# Patient Record
Sex: Female | Born: 1975 | Race: White | Hispanic: No | Marital: Married | Smoking: Current every day smoker
Health system: Southern US, Community
[De-identification: ages and names within clinical notes are randomized; demographics above are authoritative.]

## PROBLEM LIST (undated history)

## (undated) DIAGNOSIS — I10 Essential (primary) hypertension: Secondary | ICD-10-CM

## (undated) DIAGNOSIS — G43009 Migraine without aura, not intractable, without status migrainosus: Secondary | ICD-10-CM

## (undated) DIAGNOSIS — C801 Malignant (primary) neoplasm, unspecified: Secondary | ICD-10-CM

## (undated) DIAGNOSIS — F419 Anxiety disorder, unspecified: Secondary | ICD-10-CM

## (undated) DIAGNOSIS — Z8489 Family history of other specified conditions: Secondary | ICD-10-CM

## (undated) HISTORY — DX: Essential (primary) hypertension: I10

## (undated) HISTORY — DX: Anxiety disorder, unspecified: F41.9

## (undated) HISTORY — DX: Migraine without aura, not intractable, without status migrainosus: G43.009

## (undated) HISTORY — DX: Malignant (primary) neoplasm, unspecified: C80.1

---

## 1997-09-03 ENCOUNTER — Other Ambulatory Visit: Admission: RE | Admit: 1997-09-03 | Discharge: 1997-09-03 | Payer: Self-pay | Admitting: Obstetrics and Gynecology

## 1998-04-06 ENCOUNTER — Inpatient Hospital Stay (HOSPITAL_COMMUNITY): Admission: AD | Admit: 1998-04-06 | Discharge: 1998-04-08 | Payer: Self-pay | Admitting: Obstetrics and Gynecology

## 1998-05-13 ENCOUNTER — Other Ambulatory Visit: Admission: RE | Admit: 1998-05-13 | Discharge: 1998-05-13 | Payer: Self-pay | Admitting: Obstetrics and Gynecology

## 1999-05-07 ENCOUNTER — Other Ambulatory Visit: Admission: RE | Admit: 1999-05-07 | Discharge: 1999-05-07 | Payer: Self-pay | Admitting: Obstetrics and Gynecology

## 2000-04-25 ENCOUNTER — Other Ambulatory Visit: Admission: RE | Admit: 2000-04-25 | Discharge: 2000-04-25 | Payer: Self-pay | Admitting: Obstetrics and Gynecology

## 2001-05-25 ENCOUNTER — Other Ambulatory Visit: Admission: RE | Admit: 2001-05-25 | Discharge: 2001-05-25 | Payer: Self-pay | Admitting: Obstetrics and Gynecology

## 2002-05-27 ENCOUNTER — Other Ambulatory Visit: Admission: RE | Admit: 2002-05-27 | Discharge: 2002-05-27 | Payer: Self-pay | Admitting: Obstetrics and Gynecology

## 2002-08-01 ENCOUNTER — Ambulatory Visit (HOSPITAL_BASED_OUTPATIENT_CLINIC_OR_DEPARTMENT_OTHER): Admission: RE | Admit: 2002-08-01 | Discharge: 2002-08-01 | Payer: Self-pay | Admitting: Plastic Surgery

## 2002-08-01 ENCOUNTER — Encounter (INDEPENDENT_AMBULATORY_CARE_PROVIDER_SITE_OTHER): Payer: Self-pay | Admitting: Specialist

## 2002-12-31 HISTORY — PX: TONSILLECTOMY AND ADENOIDECTOMY: SHX28

## 2003-05-16 DIAGNOSIS — C801 Malignant (primary) neoplasm, unspecified: Secondary | ICD-10-CM

## 2003-05-16 HISTORY — DX: Malignant (primary) neoplasm, unspecified: C80.1

## 2003-06-20 ENCOUNTER — Other Ambulatory Visit: Admission: RE | Admit: 2003-06-20 | Discharge: 2003-06-20 | Payer: Self-pay | Admitting: Obstetrics and Gynecology

## 2003-08-14 ENCOUNTER — Encounter: Admission: RE | Admit: 2003-08-14 | Discharge: 2003-08-14 | Payer: Self-pay | Admitting: Gastroenterology

## 2004-08-24 ENCOUNTER — Other Ambulatory Visit: Admission: RE | Admit: 2004-08-24 | Discharge: 2004-08-24 | Payer: Self-pay | Admitting: Obstetrics and Gynecology

## 2005-10-18 ENCOUNTER — Other Ambulatory Visit: Admission: RE | Admit: 2005-10-18 | Discharge: 2005-10-18 | Payer: Self-pay | Admitting: Obstetrics and Gynecology

## 2006-11-03 ENCOUNTER — Other Ambulatory Visit: Admission: RE | Admit: 2006-11-03 | Discharge: 2006-11-03 | Payer: Self-pay | Admitting: Obstetrics and Gynecology

## 2007-11-07 ENCOUNTER — Other Ambulatory Visit: Admission: RE | Admit: 2007-11-07 | Discharge: 2007-11-07 | Payer: Self-pay | Admitting: Obstetrics and Gynecology

## 2010-07-02 NOTE — Op Note (Signed)
NAME:  Linda Ford, Linda Ford                         ACCOUNT NO.:  [id number]   MEDICAL RECORD NO.:  [id number]                   PATIENT TYPE:  AMB   LOCATION:  DSC                                  FACILITY:  [facility]   PHYSICIAN:  Brantley Persons, M.D.             DATE OF BIRTH:  08/13/1975   DATE OF PROCEDURE:  08/01/2002  DATE OF DISCHARGE:                                 OPERATIVE REPORT   PREOPERATIVE DIAGNOSES:  1. Basal cell carcinoma, right upper lip.  2. Recurrent infected skin mass, right groin.   POSTOPERATIVE DIAGNOSES:  1. Basal cell carcinoma, right upper lip.  2. Recurrent infected skin mass, right groin.   PROCEDURES:  1. Excision of 1.1 cm basal cell cancer of the right upper lip.  2. Intermediate closure of 1.3 cm right upper lip incision.  3. Excision of 2.0 cm right groin skin mass.  4. Complex closure of 2.5 cm right groin incision.   ATTENDING SURGEON:  Brantley Persons, M.D.   ANESTHESIA:  1% lidocaine with epinephrine.   COMPLICATIONS:  None.   INDICATION FOR PROCEDURE:  The patient is a 35 year old Caucasian female who  was referred by [facility]. Danella Deis, M.D., for excision of a right upper lip  basal cell cancer.  This has been biopsied twice and the pathology indicates  that it might be a basal cell cancer.  She therefore presents to undergo  excision of this skin cancer with an intraoperative frozen section diagnosis  to assess the margins.  Additionally the patient has a soft tissue mass  present in the right groin.  It will sometimes ooze or drain fluid when it  becomes infected.  She has been treated with antibiotics and the  inflammation has resolved; however, the mass is still present, so she would  like to see about having this excised at the same time to avoid recurrent  infections.   PROCEDURE:  The patient was brought to the procedure room and placed on the  table in the supine position.  First the lower half of the face was prepped  with  Betadine and draped in a sterile fashion.  The skin and subcutaneous  tissues in the area of the basal cell cancer were then injected with 1%  lidocaine with epinephrine.  After adequate hemostasis and anesthesia had  taken effect, the procedure was begun.  Using loupe magnification, the basal  cell cancer-yielding biopsy site was identified.  At least 1 cm margins were  then marked circumferentially around the basal cell cancer.  The cancer was  thus excised and marked at the 12 o'clock position with a nylon suture.  The  specimen was then sent to the pathologist for an intraoperative frozen  section diagnosis.  I was on standby while the pathologist, Molly Maduro T.  Jennye Moccasin, M.D., read the frozen section.  He felt that the basal cell cancer  had been removed entirely with clear  surgical margins.  We therefore  proceeded with closure of this wound.  The skin edges were then undermined  for easier closure.  The wound was closed in intermediate fashion.  The  deeper sutures and dermal layer were both closed with 3-0 Monocryl suture.  The skin was then closed with a 6-0 Prolene running baseball-type stitch.  Attention was then turned to the right groin.  The groin was prepped with  Betadine and draped in a sterile fashion.  The skin and subcutaneous tissues  in the area of the mass were then injected with 1% lidocaine with  epinephrine.  After adequate hemostasis and anesthesia had taken effect, the  procedure was begun.  Using loupe magnification, all of the skin changes  that were present were identified.  The mass was also palpated.  The lines  of skin excision were then marked around this area.  There was at least a 1  mm margin present around the skin changes.  The mass was thus excised full-  thickness through the subcutaneous tissues down to the superficial fascial  layer.  The wound was then closed in complex fashion.  The fascia was closed  using 3-0 Monocryl suture.  The dermis was then  closed also with 3-0  Monocryl suture.  The skin also was then closed with a 3-0 Monocryl suture.  The right groin incision was dressed with Benzoin and Steri-Strips and the  right upper lip incision with bacitracin ointment.  There were no  complications.  The patient tolerated the procedure well.  She was then  given proper postoperative wound care instructions and discharged home in  stable condition.  Follow-up appointment will be tomorrow in the office.  Total length of basal cell cancer removed was 1.1 cm with a 1.3 cm  intermediate closure of the incision.  Total length of groin mass removed  was 2.0 cm, as this was necessary to get entirely around the lesion and  granulation tissue, along with a 2.5 cm complex closure of the incision.                                               Brantley Persons, M.D.    MC/MEDQ  D:  08/01/2002  T:  08/05/2002  Job:  [id number]

## 2011-02-15 HISTORY — PX: REFRACTIVE SURGERY: SHX103

## 2012-04-18 HISTORY — PX: NOVASURE ABLATION: SHX5394

## 2012-05-18 ENCOUNTER — Other Ambulatory Visit: Payer: Self-pay | Admitting: *Deleted

## 2012-05-18 MED ORDER — NADOLOL 40 MG PO TABS: 40.0000 mg | ORAL_TABLET | Freq: Every day | ORAL | Status: DC

## 2012-05-18 NOTE — Telephone Encounter (Signed)
Last seen 12/13 

## 2012-06-08 ENCOUNTER — Encounter: Payer: Self-pay | Admitting: Gynecology

## 2012-06-13 ENCOUNTER — Ambulatory Visit: Payer: Self-pay | Admitting: Gynecology

## 2012-06-22 ENCOUNTER — Ambulatory Visit (INDEPENDENT_AMBULATORY_CARE_PROVIDER_SITE_OTHER): Payer: BC Managed Care – PPO | Admitting: Gynecology

## 2012-06-22 VITALS — BP 126/68

## 2012-06-22 DIAGNOSIS — N898 Other specified noninflammatory disorders of vagina: Secondary | ICD-10-CM

## 2012-06-22 DIAGNOSIS — N920 Excessive and frequent menstruation with regular cycle: Secondary | ICD-10-CM | POA: Insufficient documentation

## 2012-06-22 LAB — POCT WET PREP (WET MOUNT): Clue Cells Wet Prep Whiff POC: NEGATIVE

## 2012-06-22 NOTE — Progress Notes (Signed)
Subjective:     Patient ID: Larene Beach, female   DOB: 12/29/1975, 37 y.o.   MRN: [id number]  HPI Comments: Pt presents 6w s/p novasure ablation done in the office, pt reports no menses, cramped and spotted for 2d.  Pt reports brownish sludge d/c-pantiliner changed 2-3x/d Denies fever, chills,.  No dyspareunia.  No change in d/c after sex.     Review of Systems Per HPI    Objective:   Physical Exam BP 126/68  LMP 05/15/2012 General appearance: alert, cooperative and appears stated age Abdomen: soft, non-tender; bowel sounds normal; no masses,  no organomegaly Pelvic: cervix normal in appearance, external genitalia normal, no adnexal masses or tenderness, no cervical motion tenderness and uterus normal size, shape, and consistency No uterine tenderness, thin brown d/c in vault    Assessment:     post-op novasure- overall doing well Vaginal discharge    Plan:     Pt to watch menses, anticipate they will be light or none Discharge normal s/p ablation-pt assured F/u prn

## 2012-07-26 ENCOUNTER — Other Ambulatory Visit: Payer: Self-pay | Admitting: Nurse Practitioner

## 2012-07-30 ENCOUNTER — Other Ambulatory Visit: Payer: Self-pay | Admitting: Nurse Practitioner

## 2012-07-30 NOTE — Telephone Encounter (Signed)
Last seen 12/13 

## 2012-09-10 ENCOUNTER — Other Ambulatory Visit: Payer: Self-pay | Admitting: Nurse Practitioner

## 2012-10-16 ENCOUNTER — Ambulatory Visit: Payer: BC Managed Care – PPO | Admitting: Nurse Practitioner

## 2012-11-21 ENCOUNTER — Encounter: Payer: Self-pay | Admitting: Gynecology

## 2012-12-04 ENCOUNTER — Ambulatory Visit (INDEPENDENT_AMBULATORY_CARE_PROVIDER_SITE_OTHER): Payer: BC Managed Care – PPO

## 2012-12-04 ENCOUNTER — Encounter: Payer: Self-pay | Admitting: Family Medicine

## 2012-12-04 ENCOUNTER — Encounter (INDEPENDENT_AMBULATORY_CARE_PROVIDER_SITE_OTHER): Payer: Self-pay

## 2012-12-04 ENCOUNTER — Ambulatory Visit (INDEPENDENT_AMBULATORY_CARE_PROVIDER_SITE_OTHER): Payer: BC Managed Care – PPO | Admitting: Family Medicine

## 2012-12-04 VITALS — BP 102/69 | HR 66 | Temp 98.3°F | Ht 63.5 in | Wt 134.0 lb

## 2012-12-04 DIAGNOSIS — M24139 Other articular cartilage disorders, unspecified wrist: Secondary | ICD-10-CM

## 2012-12-04 DIAGNOSIS — M25531 Pain in right wrist: Secondary | ICD-10-CM

## 2012-12-04 DIAGNOSIS — M25539 Pain in unspecified wrist: Secondary | ICD-10-CM

## 2012-12-04 DIAGNOSIS — M25331 Other instability, right wrist: Secondary | ICD-10-CM

## 2012-12-04 MED ORDER — MELOXICAM 15 MG PO TABS: 15.0000 mg | ORAL_TABLET | Freq: Every day | ORAL | Status: DC

## 2012-12-04 NOTE — Progress Notes (Signed)
New Patient History and Physical  Patient name: Linda Ford Medical record number: [id number] Date of birth: 1976/01/09 Age: 37 y.o. Gender: female  Primary Care Provider: Bennie Pierini, FNP  Chief Complaint: wrist pain  History of Present Illness: The patient reports ulnar sided wrist pain over the past 3-4 days. Predominantly over the ulnar styloid. Pain is not constant but does seem to be exacerbated by certain movements like respiration supination. Minimal numbness and tingling. No swelling. No known injury, though patient will she help the most boxes late last week. Denies falling or direct trauma.   Past Medical History: Patient Active Problem List   Diagnosis Date Noted  . Menorrhagia 06/22/2012   Past Medical History  Diagnosis Date  . Hypertension   . Anxiety   . Cancer 04/05    basal cell, face and legs  . Migraine     Past Surgical History: Past Surgical History  Procedure Laterality Date  . Tonsillectomy and adenoidectomy  12/31/02    Social History: History   Social History  . Marital Status: Married    Spouse Name: N/A    Number of Children: N/A  . Years of Education: N/A   Social History Main Topics  . Smoking status: Former Smoker    Types: Cigarettes    Quit date: 12/01/2012  . Smokeless tobacco: Never Used  . Alcohol Use: Yes     Comment: occ wine  . Drug Use: No  . Sexual Activity: Yes    Birth Control/ Protection: Surgical     Comment: vasectomy   Other Topics Concern  . None   Social History Narrative  . None    Family History: Family History  Problem Relation Age of Onset  . Heart disease Father     Allergies: Allergies  Allergen Reactions  . Macrobid [Nitrofurantoin] Hives    Current Outpatient Prescriptions  Medication Sig Dispense Refill  . sertraline (ZOLOFT) 100 MG tablet Take 100 mg by mouth daily.      . valACYclovir (VALTREX) 1000 MG tablet TAKE 2 TABLETS BY MOUTH TWICE A DAY AT FEVER BLISTER ONSET  12  tablet  1  . meloxicam (MOBIC) 15 MG tablet Take 1 tablet (15 mg total) by mouth daily.  30 tablet  1   No current facility-administered medications for this visit.   Review Of Systems: 12 point ROS negative except as noted above in HPI.  Physical Exam: Filed Vitals:   12/04/12 1500  BP: 102/69  Pulse: 66  Temp: 98.3 F (36.8 C)    General: alert and cooperative HEENT: PERRLA and extra ocular movement intact Heart: S1, S2 normal, no murmur, rub or gallop, regular rate and rhythm Lungs: clear to auscultation Abdomen: abdomen is soft without significant tenderness, masses, organomegaly or guarding Extremities: extremities normal, atraumatic, no cyanosis or edema and Positive ulnar side pain of the right wrist. Positive pain with ulnar deviation. Positive pain over same distribution with resisted supination and pronation. Grip strength contact. Neurovascularly intact. Skin:no rashes Neurology: normal without focal findings  Labs and Imaging: [facility] reading (PRIMARY) by  Dr. Alvester Morin Right wrist x-ray preliminarily negative for acute fracture or dislocation.                                    Assessment and Plan: Wrist pain, acute, right - Plan: DG Wrist Complete Right, meloxicam (MOBIC) 15 MG tablet  TFCC (triangular fibrocartilage complex) injury,  right - Plan: meloxicam (MOBIC) 15 MG tablet  Suspect TFCC injury. We'll place her respirations. NSAIDs. Rice. Discussed general musculoskeletal route 5. Follow up as needed.       Doree Albee MD

## 2012-12-04 NOTE — Patient Instructions (Signed)
Wrist Pain  Wrist injuries are frequent in adults and children. A sprain is an injury to the ligaments that hold your bones together. A strain is an injury to muscle or muscle cord-like structures (tendons) from stretching or pulling. Generally, when wrists are moderately tender to touch following a fall or injury, a break in the bone (fracture) may be present. Most wrist sprains or strains are better in 3 to 5 days, but complete healing may take several weeks.  HOME CARE INSTRUCTIONS    Put ice on the injured area.   Put ice in a plastic bag.   Place a towel between your skin and the bag.   Leave the ice on for 15-20 minutes, 3-4 times a day, for the first 2 days.   Keep your arm raised above the level of your heart whenever possible to reduce swelling and pain.   Rest the injured area for at least 48 hours or as directed by your caregiver.   If a splint or elastic bandage has been applied, use it for as long as directed by your caregiver or until seen by a caregiver for a follow-up exam.   Only take over-the-counter or prescription medicines for pain, discomfort, or fever as directed by your caregiver.   Keep all follow-up appointments. You may need to follow up with a specialist or have follow-up X-rays. Improvement in pain level is not a guarantee that you did not fracture a bone in your wrist. The only way to determine whether or not you have a broken bone is by X-ray.  SEEK IMMEDIATE MEDICAL CARE IF:    Your fingers are swollen, very red, white, or cold and blue.   Your fingers are numb or tingling.   You have increasing pain.   You have difficulty moving your fingers.  MAKE SURE YOU:    Understand these instructions.   Will watch your condition.   Will get help right away if you are not doing well or get worse.  Document Released: 11/10/2004 Document Revised: 04/25/2011 Document Reviewed: 03/24/2010  Exit[employer] Patient Information 2014 [employer].

## 2013-03-14 ENCOUNTER — Ambulatory Visit: Payer: Self-pay | Admitting: Gynecology

## 2013-03-15 ENCOUNTER — Ambulatory Visit: Payer: Self-pay | Admitting: Gynecology

## 2013-03-27 ENCOUNTER — Ambulatory Visit: Payer: Self-pay | Admitting: Gynecology

## 2013-04-05 ENCOUNTER — Ambulatory Visit: Payer: Self-pay | Admitting: Gynecology

## 2013-04-12 ENCOUNTER — Telehealth: Payer: Self-pay | Admitting: *Deleted

## 2013-04-12 ENCOUNTER — Other Ambulatory Visit: Payer: Self-pay | Admitting: *Deleted

## 2013-04-12 MED ORDER — SERTRALINE HCL 100 MG PO TABS: 100.0000 mg | ORAL_TABLET | Freq: Every day | ORAL | Status: DC

## 2013-04-12 NOTE — Telephone Encounter (Signed)
Encounter opened in error.  See previous note for refill request. Closing encounter.

## 2013-04-12 NOTE — Telephone Encounter (Signed)
Patient calling to reschedule annual that is scheduled for 04-15-13.  Due to inclement weather, meetings have been rescheduled to appt time.   Appt rescheduled to 05-01-13 and requests one month refill Zoloft to get to appointment sinc eshe is completely out.  [facility]. Advised we will call her back if any problem with refill.    Last AEX 03-08-12 with Dr Charlies Constable, see preEPIC chart. Last OV 06-2012.  Zoloft is in computer but I don't see here we have prescribed this for her.  Ok to refill?  Paper chart to your office.

## 2013-04-12 NOTE — Telephone Encounter (Deleted)
eScribe request from [facility] for refill on SERTRALINE Last filled - *** Last AEX - *** Next AEX - ***

## 2013-04-15 ENCOUNTER — Ambulatory Visit: Payer: Self-pay | Admitting: Gynecology

## 2013-05-01 ENCOUNTER — Ambulatory Visit (INDEPENDENT_AMBULATORY_CARE_PROVIDER_SITE_OTHER): Payer: BC Managed Care – PPO | Admitting: Gynecology

## 2013-05-01 ENCOUNTER — Encounter: Payer: Self-pay | Admitting: Gynecology

## 2013-05-01 VITALS — BP 114/60 | HR 70 | Resp 18 | Ht 63.0 in | Wt 134.0 lb

## 2013-05-01 DIAGNOSIS — Z0000 Encounter for general adult medical examination without abnormal findings: Secondary | ICD-10-CM

## 2013-05-01 DIAGNOSIS — I10 Essential (primary) hypertension: Secondary | ICD-10-CM | POA: Insufficient documentation

## 2013-05-01 DIAGNOSIS — C4491 Basal cell carcinoma of skin, unspecified: Secondary | ICD-10-CM | POA: Insufficient documentation

## 2013-05-01 DIAGNOSIS — Z01419 Encounter for gynecological examination (general) (routine) without abnormal findings: Secondary | ICD-10-CM

## 2013-05-01 DIAGNOSIS — G43009 Migraine without aura, not intractable, without status migrainosus: Secondary | ICD-10-CM | POA: Insufficient documentation

## 2013-05-01 LAB — POCT URINALYSIS DIPSTICK
LEUKOCYTES UA: NEGATIVE
Urobilinogen, UA: NEGATIVE
pH, UA: 5

## 2013-05-01 NOTE — Progress Notes (Signed)
38 y.o. Married Caucasian female   G2P0 here for annual exam. Pt is currently sexually active.  Pt is 1y s/p ablation, overall cycles are very light and regular and she is very pleased.  Pt reports constant sinus drainage, no change with quitting smoking 80m ago.  Has not seen ENT.  No LMP recorded. Patient has had an ablation.          Sexually active: yes  The current method of family planning is Ablation and Husband has Vasectomy. .    Exercising: no  The patient does not participate in regular exercise at present. Last pap: 03/08/12 NEG HR HPV Alcohol:  Yes 1-2 drinks/wk glass of wine Tobacco: no BSE: yes  Mammogram: never  Hgb: 12.7 ; Urine: Negative    Health Maintenance  Topic Date Due  . Tetanus/tdap  05/31/1992  . Influenza Vaccine  09/14/2012  . Pap Smear  03/09/2015    Family History  Problem Relation Age of Onset  . Heart disease Father     Patient Active Problem List   Diagnosis Date Noted  . Menorrhagia 06/22/2012    Past Medical History  Diagnosis Date  . Hypertension   . Anxiety   . Cancer 04/05    basal cell, face and legs  . Migraine     Past Surgical History  Procedure Laterality Date  . Tonsillectomy and adenoidectomy  12/31/02    Allergies: Macrobid  Current Outpatient Prescriptions  Medication Sig Dispense Refill  . meloxicam (MOBIC) 15 MG tablet Take 1 tablet (15 mg total) by mouth daily.  30 tablet  1  . sertraline (ZOLOFT) 100 MG tablet Take 1 tablet (100 mg total) by mouth daily.  30 tablet  1  . valACYclovir (VALTREX) 1000 MG tablet TAKE 2 TABLETS BY MOUTH TWICE A DAY AT FEVER BLISTER ONSET  12 tablet  1   No current facility-administered medications for this visit.    ROS: Pertinent items are noted in HPI.  Exam:    Ht 5\' 3"  (1.6 m)  Wt 134 lb (60.782 kg)  BMI 23.74 kg/m2 Weight change: @WEIGHTCHANGE @ Last 3 height recordings:  Ht Readings from Last 3 Encounters:  05/01/13 5\' 3"  (1.6 m)  12/04/12 5' 3.5" (1.613 m)    General appearance: alert, cooperative and appears stated age Head: Normocephalic, without obvious abnormality, atraumatic Neck: no adenopathy, no carotid bruit, no JVD, supple, symmetrical, trachea midline and thyroid not enlarged, symmetric, no tenderness/mass/nodules Lungs: clear to auscultation bilaterally Breasts: normal appearance, no masses or tenderness Heart: regular rate and rhythm, S1, S2 normal, no murmur, click, rub or gallop Abdomen: soft, non-tender; bowel sounds normal; no masses,  no organomegaly Extremities: extremities normal, atraumatic, no cyanosis or edema Skin: Skin color, texture, turgor normal. No rashes or lesions Lymph nodes: Cervical, supraclavicular, and axillary nodes normal. no inguinal nodes palpated Neurologic: Grossly normal   Pelvic: External genitalia:  well estrogenized              Urethra: normal appearing urethra with no masses, tenderness or lesions              Bartholins and Skenes: Bartholin's, Urethra, Skene's normal                 Vagina: normal appearing vagina with normal color and discharge, no lesions              Cervix: normal appearance              Pap taken:  no        Bimanual Exam:  Uterus:  uterus is normal size, shape, consistency and nontender                                      Adnexa:    normal adnexa in size, nontender and no masses                                      Rectovaginal: Confirms                                      Anus:  normal sphincter tone, no lesions  A: well woman     P: mammogram Cycles controled after novasure counseled on breast self exam, mammography screening, adequate intake of calcium and vitamin D, diet and exercise return annually or prn   An After Visit Summary was printed and given to the patient.

## 2013-05-01 NOTE — Patient Instructions (Signed)

## 2013-05-02 LAB — HEMOGLOBIN, FINGERSTICK: Hemoglobin, fingerstick: 12.7 g/dL (ref 12.0–16.0)

## 2013-05-22 ENCOUNTER — Other Ambulatory Visit: Payer: Self-pay | Admitting: *Deleted

## 2013-05-22 ENCOUNTER — Telehealth: Payer: Self-pay | Admitting: Gynecology

## 2013-05-22 NOTE — Telephone Encounter (Signed)
Last refilled: 04/12/13 #30/1 refill Last AEX: 05/01/13  AEX Scheduled: 05/06/14  Okay to refill?  Please Advise.

## 2013-05-22 NOTE — Telephone Encounter (Signed)
Patient is asking for a refill for sertraline (ZOLOFT) 100 MG tablet  Take 1 tablet (100 mg total) by mouth daily., Starting 04/12/2013, Until Discontinued, Normal, Last Dose: Not Recorded  Refills: 1 ordered [facility]acy: [id number] [place], [place]

## 2013-05-24 MED ORDER — SERTRALINE HCL 100 MG PO TABS: 100.0000 mg | ORAL_TABLET | Freq: Every day | ORAL | Status: AC

## 2013-05-24 NOTE — Telephone Encounter (Signed)
Approved      Disp Refills Start End    sertraline (ZOLOFT) 100 MG tablet 90 tablet 3      Sig - Route: Take 1 tablet (100 mg total) by mouth daily. - Oral    Class: Normal    Authorizing Provider: Azalia Bilis, MD     Patient notified that rx has been approved and sent by Dr. Charlies Constable

## 2013-05-24 NOTE — Telephone Encounter (Signed)
4/8 LMTCB

## 2013-12-16 ENCOUNTER — Encounter: Payer: Self-pay | Admitting: Gynecology

## 2013-12-19 ENCOUNTER — Telehealth: Payer: Self-pay | Admitting: Obstetrics & Gynecology

## 2013-12-19 NOTE — Telephone Encounter (Signed)
Patient calling requesting an RX to "treast a yeast infection." She declined an appointment stating, "There is no way I can come in." Please advise?

## 2013-12-19 NOTE — Telephone Encounter (Signed)
Left message to call Kaitlyn at [phone]. 

## 2013-12-24 NOTE — Telephone Encounter (Signed)
Spoke with patient. Patient states "I am better so I am just not going to worry about it." Advised if she changes her mind or needs anything to give [place] a call. Patient is agreeable.  Routing to provider for final review. Patient agreeable to disposition. Will close encounter

## 2014-05-06 ENCOUNTER — Ambulatory Visit: Payer: BC Managed Care – PPO | Admitting: Gynecology

## 2014-05-13 ENCOUNTER — Ambulatory Visit: Payer: BC Managed Care – PPO | Admitting: Obstetrics & Gynecology

## 2014-06-20 ENCOUNTER — Other Ambulatory Visit: Payer: Self-pay | Admitting: *Deleted

## 2014-06-20 NOTE — Telephone Encounter (Signed)
Incoming fax from [place]   Medication refill request: Sertraline  Last AEX:  05/01/13 TL Next AEX: not schedule Last MMG (if hormonal medication request): none Refill authorized: 05/24/13 #90tabs/ 3 R.  Called patient. She states she has tranfer her care some where else and to disregard request.

## 2018-11-23 ENCOUNTER — Other Ambulatory Visit: Payer: Self-pay | Admitting: *Deleted

## 2018-11-23 DIAGNOSIS — Z20822 Contact with and (suspected) exposure to covid-19: Secondary | ICD-10-CM

## 2018-11-25 LAB — NOVEL CORONAVIRUS, NAA: SARS-CoV-2, NAA: NOT DETECTED

## 2019-04-21 ENCOUNTER — Ambulatory Visit: Payer: Self-pay | Attending: Internal Medicine

## 2019-04-21 DIAGNOSIS — Z23 Encounter for immunization: Secondary | ICD-10-CM | POA: Insufficient documentation

## 2019-04-21 NOTE — Progress Notes (Signed)
   Covid-19 Vaccination Clinic  Name:  Linda Ford    MRN: [id number] DOB: 04-15-1975  04/21/2019  Linda Ford was observed post Covid-19 immunization for 15 minutes without incident. She was provided with Vaccine Information Sheet and instruction to access the V-Safe system.   Linda Ford was instructed to call 911 with any severe reactions post vaccine: Marland Kitchen Difficulty breathing  . Swelling of face and throat  . A fast heartbeat  . A bad rash all over body  . Dizziness and weakness   Immunizations Administered    Name Date Dose VIS Date Route   Pfizer COVID-19 Vaccine 04/21/2019  5:36 PM 0.3 mL 01/25/2019 Intramuscular   Manufacturer: [place]   Lot: [id number]   [place]: [id number]

## 2019-05-12 ENCOUNTER — Ambulatory Visit: Payer: Self-pay | Attending: Internal Medicine

## 2019-05-12 DIAGNOSIS — Z23 Encounter for immunization: Secondary | ICD-10-CM

## 2019-05-12 NOTE — Progress Notes (Signed)
   Covid-19 Vaccination Clinic  Name:  PAW CARBINE    MRN: [id number] DOB: 10/20/1975  05/12/2019  Ms. Schwimmer was observed post Covid-19 immunization for 15 minutes without incident. She was provided with Vaccine Information Sheet and instruction to access the V-Safe system.   Ms. Saldate was instructed to call 911 with any severe reactions post vaccine: Marland Kitchen Difficulty breathing  . Swelling of face and throat  . A fast heartbeat  . A bad rash all over body  . Dizziness and weakness   Immunizations Administered    Name Date Dose VIS Date Route   Pfizer COVID-19 Vaccine 05/12/2019  3:52 PM 0.3 mL 01/25/2019 Intramuscular   Manufacturer: [employer]   Lot: [id number]   [place]: [id number]

## 2019-05-29 ENCOUNTER — Ambulatory Visit: Payer: Self-pay | Attending: Internal Medicine

## 2019-05-29 DIAGNOSIS — Z20822 Contact with and (suspected) exposure to covid-19: Secondary | ICD-10-CM

## 2019-05-30 LAB — NOVEL CORONAVIRUS, NAA: SARS-CoV-2, NAA: NOT DETECTED

## 2019-05-30 LAB — SARS-COV-2, NAA 2 DAY TAT

## 2019-07-30 ENCOUNTER — Other Ambulatory Visit: Payer: Self-pay

## 2019-07-30 ENCOUNTER — Encounter: Payer: Self-pay | Admitting: Physician Assistant

## 2019-07-30 ENCOUNTER — Ambulatory Visit: Payer: BC Managed Care – PPO | Admitting: Physician Assistant

## 2019-07-30 DIAGNOSIS — Z1283 Encounter for screening for malignant neoplasm of skin: Secondary | ICD-10-CM | POA: Diagnosis not present

## 2019-07-30 DIAGNOSIS — L700 Acne vulgaris: Secondary | ICD-10-CM

## 2019-07-30 DIAGNOSIS — K130 Diseases of lips: Secondary | ICD-10-CM | POA: Diagnosis not present

## 2019-07-30 DIAGNOSIS — D485 Neoplasm of uncertain behavior of skin: Secondary | ICD-10-CM

## 2019-07-30 MED ORDER — ALCLOMETASONE DIPROPIONATE 0.05 % EX CREA: TOPICAL_CREAM | Freq: Two times a day (BID) | CUTANEOUS | 1 refills | Status: AC | PRN

## 2019-07-30 MED ORDER — AMZEEQ 4 % EX FOAM
1.0000 | Freq: Every day | CUTANEOUS | 0 refills | Status: DC
Start: 2019-07-30 — End: 2020-01-21

## 2019-07-30 MED ORDER — KETOCONAZOLE 2 % EX CREA: 1.0000 "application " | TOPICAL_CREAM | Freq: Two times a day (BID) | CUTANEOUS | 0 refills | Status: AC

## 2019-07-30 NOTE — Progress Notes (Signed)
   Follow-Up Visit   Subjective  Linda Ford is a 44 y.o. female who presents for the following: Skin Problem (corners of mouth cracking open x 1 year- tx- clotrimazole & hydro cream - helps some , right cheek- puffy spot x 6-8 months).   The following portions of the chart were reviewed this encounter and updated as appropriate: Tobacco  Allergies  Meds  Problems  Med Hx  Surg Hx  Fam Hx      Objective  Well appearing patient in no apparent distress; mood and affect are within normal limits.  All skin waist up examined.  Objective  Waist Up: No Dn or signs of NMSC  Objective  Right Malar Cheek: Pink plaque.     Objective  Mid Lower Cutaneous Lip: cysts  Objective  Left Oral Commissure, Right Oral Commissure: Redness with slight scale  Assessment & Plan  Screening exam for skin cancer Waist Up  Yearly skin exams  Neoplasm of uncertain behavior of skin Right Malar Cheek  Epidermal / dermal shaving  Lesion diameter (cm):  1.1 Informed consent: discussed and consent obtained   Timeout: patient name, date of birth, surgical site, and procedure verified   Procedure prep:  Patient was prepped and draped in usual sterile fashion Prep type:  Chlorhexidine Anesthesia: the lesion was anesthetized in a standard fashion   Anesthetic:  1% lidocaine w/ epinephrine 1-100,000 local infiltration Instrument used: DermaBlade   Hemostasis achieved with: aluminum chloride   Outcome: patient tolerated procedure well   Post-procedure details: sterile dressing applied and wound care instructions given   Dressing type: petrolatum gauze, petrolatum and bandage    Specimen 1 - Surgical pathology Differential Diagnosis: epidermal hyperplasia, bcc scc Check Margins: No  Acne vulgaris Mid Lower Cutaneous Lip  Ordered Medications: Minocycline HCl Micronized (AMZEEQ) 4 % FOAM  Angular cheilitis (2) Left Oral Commissure; Right Oral Commissure  alclomethasone  (ACLOVATE) 0.05 % cream - Left Oral Commissure, Right Oral Commissure  Anaerobic and Aerobic Culture - Left Oral Commissure, Right Oral Commissure  ketoconazole (NIZORAL) 2 % cream - Left Oral Commissure, Right Oral Commissure

## 2019-07-30 NOTE — Patient Instructions (Signed)

## 2019-08-01 ENCOUNTER — Telehealth: Payer: Self-pay | Admitting: Physician Assistant

## 2019-08-01 NOTE — Telephone Encounter (Signed)
Was given 2 Rx's; not sure which one is for what. Alclometasone (for corners of mouth?) and ketoconazole (for where place was taken off face?)

## 2019-08-01 NOTE — Telephone Encounter (Signed)
Phone call to patient regarding question on where to use the two prescriptions she was given by Azalee Course.  Voicemail left for patient to give the office a call back.

## 2019-08-01 NOTE — Telephone Encounter (Signed)
Left message on patient voicemail reminding her that she is suppose to mix the two prescriptions together and put on corners of mouth.  Told patient to call us if needed.

## 2019-08-05 ENCOUNTER — Telehealth: Payer: Self-pay

## 2019-08-05 LAB — ANAEROBIC AND AEROBIC CULTURE
MICRO NUMBER:: 10592917
MICRO NUMBER:: 10592918
SPECIMEN QUALITY:: ADEQUATE
SPECIMEN QUALITY:: ADEQUATE

## 2019-08-05 NOTE — Telephone Encounter (Signed)
Phone call to patient with her results.  Patient aware of results.

## 2019-08-05 NOTE — Telephone Encounter (Signed)
-----   Message from Warren Danes, [place] sent at 08/02/2019  5:10 PM EDT ----- No infection continue topical treatment

## 2019-09-11 ENCOUNTER — Other Ambulatory Visit (HOSPITAL_COMMUNITY): Payer: Self-pay | Admitting: Obstetrics & Gynecology

## 2019-09-11 DIAGNOSIS — R062 Wheezing: Secondary | ICD-10-CM

## 2019-09-12 ENCOUNTER — Other Ambulatory Visit: Payer: Self-pay

## 2019-09-12 ENCOUNTER — Ambulatory Visit (HOSPITAL_COMMUNITY)
Admission: RE | Admit: 2019-09-12 | Discharge: 2019-09-12 | Disposition: A | Discharge status: Home / Self Care | Payer: BC Managed Care – PPO | Source: Ambulatory Visit | Attending: Obstetrics & Gynecology | Admitting: Obstetrics & Gynecology

## 2019-09-12 DIAGNOSIS — R062 Wheezing: Secondary | ICD-10-CM | POA: Insufficient documentation

## 2020-01-28 NOTE — Progress Notes (Signed)
Linda Ford  01/28/2020      Your procedure is scheduled on12/28/2021   Report to [place]  at   [place].  Call this number if you have problems the morning of surgery:[phone]  OUR ADDRESS IS [place], WE ARE LOCATED IN THE [facility] WITH [facility].   Remember:  Do not eat food after midnight. May have clear liquids from 12 midnite until 0430am morning of surgery thyen nothing by mouth.  Drink ensure preop drink by 0430am  Morning of surgery.       CLEAR LIQUID DIET   Foods Allowed                                                                 Coffee and tea, regular and decaf                             l  Plain Jell-O any favor except red or purple                                         Fruit ices (not with fruit pulp)                                     Iced Popsicles                                  Carbonated beverages, regular and diet                                    Cranberry, grape and apple juices Sports drinks like Gatorade Lightly seasoned clear broth or consume(fat free) Sugar, honey syrup        _____________________________________________________________________    Take these medicines the morning of surgery with A SIP OF WATER  Zyrtec, zoloft    Do not wear jewelry, make-up or nail polish.  Do not wear lotions, powders, or perfumes, or deoderant.  Do not shave 48 hours prior to surgery.  Men may shave face and neck.  Do not bring valuables to the hospital.  [facility] is not responsible for any belongings or valuables.  Contacts, dentures or bridgework may not be worn into surgery.  Leave your suitcase in the car.  After surgery it may be brought to your room.  For patients admitted to the hospital, discharge time will be determined by your treatment team.  Patients discharged the day of surgery will not be allowed to drive home.     Please read over the following fact sheets that you  were given:   [facility] - Preparing for Surgery Before surgery, you can play an important role.  Because skin is not sterile, your skin needs to be as free of germs as possible.  You can reduce the number of germs on your skin by washing with CHG (chlorahexidine  gluconate) soap before surgery.  CHG is an antiseptic cleaner which kills germs and bonds with the skin to continue killing germs even after washing. Please DO NOT use if you have an allergy to CHG or antibacterial soaps.  If your skin becomes reddened/irritated stop using the CHG and inform your nurse when you arrive at Short Stay. Do not shave (including legs and underarms) for at least 48 hours prior to the first CHG shower.  You may shave your face/neck. Please follow these instructions carefully:  1.  Shower with CHG Soap the night before surgery and the  morning of Surgery.  2.  If you choose to wash your hair, wash your hair first as usual with your  normal  shampoo.  3.  After you shampoo, rinse your hair and body thoroughly to remove the  shampoo.                           4.  Use CHG as you would any other liquid soap.  You can apply chg directly  to the skin and wash                       Gently with a scrungie or clean washcloth.  5.  Apply the CHG Soap to your body ONLY FROM THE NECK DOWN.   Do not use on face/ open                           Wound or open sores. Avoid contact with eyes, ears mouth and genitals (private parts).                       Wash face,  Genitals (private parts) with your normal soap.             6.  Wash thoroughly, paying special attention to the area where your surgery  will be performed.  7.  Thoroughly rinse your body with warm water from the neck down.  8.  DO NOT shower/wash with your normal soap after using and rinsing off  the CHG Soap.                9.  Pat yourself dry with a clean towel.            10.  Wear clean pajamas.            11.  Place clean sheets on your bed the night of your  first shower and do not  sleep with pets. Day of Surgery : Do not apply any lotions/deodorants the morning of surgery.  Please wear clean clothes to the hospital/surgery center.  FAILURE TO FOLLOW THESE INSTRUCTIONS MAY RESULT IN THE CANCELLATION OF YOUR SURGERY PATIENT SIGNATURE_________________________________  NURSE SIGNATURE__________________________________  ________________________________________________________________________

## 2020-01-30 ENCOUNTER — Encounter (HOSPITAL_COMMUNITY): Payer: Self-pay

## 2020-01-30 ENCOUNTER — Encounter (HOSPITAL_COMMUNITY)
Admission: RE | Admit: 2020-01-30 | Discharge: 2020-01-30 | Disposition: A | Discharge status: Home / Self Care | Payer: BC Managed Care – PPO | Source: Ambulatory Visit | Attending: Obstetrics & Gynecology | Admitting: Obstetrics & Gynecology

## 2020-01-30 ENCOUNTER — Other Ambulatory Visit: Payer: Self-pay

## 2020-01-30 DIAGNOSIS — Z01818 Encounter for other preprocedural examination: Secondary | ICD-10-CM | POA: Diagnosis not present

## 2020-01-30 HISTORY — DX: Family history of other specified conditions: Z84.89

## 2020-01-30 LAB — CBC
HCT: 41 % (ref 36.0–46.0)
Hemoglobin: 13.7 g/dL (ref 12.0–15.0)
MCH: 31.5 pg (ref 26.0–34.0)
MCHC: 33.4 g/dL (ref 30.0–36.0)
MCV: 94.3 fL (ref 80.0–100.0)
Platelets: 202 10*3/uL (ref 150–400)
RBC: 4.35 MIL/uL (ref 3.87–5.11)
RDW: 12 % (ref 11.5–15.5)
WBC: 7.5 10*3/uL (ref 4.0–10.5)
nRBC: 0 % (ref 0.0–0.2)

## 2020-01-30 LAB — COMPREHENSIVE METABOLIC PANEL
ALT: 14 U/L (ref 0–44)
AST: 32 U/L (ref 15–41)
Albumin: 4.7 g/dL (ref 3.5–5.0)
Alkaline Phosphatase: 33 U/L — ABNORMAL LOW (ref 38–126)
Anion gap: 9 (ref 5–15)
BUN: 15 mg/dL (ref 6–20)
CO2: 27 mmol/L (ref 22–32)
Calcium: 9.4 mg/dL (ref 8.9–10.3)
Chloride: 103 mmol/L (ref 98–111)
Creatinine, Ser: 0.84 mg/dL (ref 0.44–1.00)
GFR, Estimated: >60 mL/min (ref >60)
Glucose, Bld: 93 mg/dL (ref 70–99)
Potassium: 3.4 mmol/L — ABNORMAL LOW (ref 3.5–5.1)
Sodium: 139 mmol/L (ref 135–145)
Total Bilirubin: 0.6 mg/dL (ref 0.3–1.2)
Total Protein: 7.3 g/dL (ref 6.5–8.1)

## 2020-01-30 LAB — TYPE AND SCREEN
ABO/RH(D): A POS
Antibody Screen: NEGATIVE

## 2020-01-30 NOTE — Progress Notes (Signed)
CMP done 01/30/2020 routed to Dr Linda Hedges.

## 2020-02-05 NOTE — H&P (Signed)
Linda Ford is an 44 y.o. female G2P2 with heavy, painful menses s/p endometrial ablation in 2014.  Patient has HTN and is a smoker and has tried POP with improvement in menstrual symptoms but with intolerable side effects (weight gain).  Patient presents today for definitive surgical management.  U/S on 09/23/19 shows uterine volume 68 cc with EMS 3.8 mm; possible adenomyosis.     Pertinent Gynecological History: Menses: flow is moderate Bleeding: dysfunctional uterine bleeding Contraception: oral progesterone-only contraceptive and vasectomy DES exposure: unknown Blood transfusions: none Sexually transmitted diseases: no past history Previous GYN Procedures: DNC and endometrial ablation  Last mammogram: normal Date: 07/16/2018 Last pap: normal Date: 07/13/17 OB History: G2, P2   Menstrual History: Menarche age: n/a No LMP recorded.    Past Medical History:  Diagnosis Date  . Anxiety   . Cancer ([place]) 04/05   basal cell, face and legs  . Family history of adverse reaction to anesthesia    mother- N/V   . Hypertension   . Migraine without aura    pt denies     Past Surgical History:  Procedure Laterality Date  . NOVASURE ABLATION  04/18/2012  . REFRACTIVE SURGERY Bilateral 2013  . TONSILLECTOMY AND ADENOIDECTOMY  12/31/02    Family History  Problem Relation Age of Onset  . Heart disease Father     Social History:  reports that she has been smoking cigarettes. She has been smoking about 0.25 packs per day. She has never used smokeless tobacco. She reports current alcohol use of about 1.0 - 2.0 standard drink of alcohol per week. She reports that she does not use drugs.  Allergies:  Allergies  Allergen Reactions  . Macrobid [Nitrofurantoin] Hives    No medications prior to admission.    Review of Systems  There were no vitals taken for this visit. Physical Exam Constitutional:      Appearance: Normal appearance.  HENT:     Head: Normocephalic and atraumatic.   Pulmonary:     Effort: Pulmonary effort is normal.  Abdominal:     Palpations: Abdomen is soft.  Musculoskeletal:     Cervical back: Normal range of motion and neck supple.  Skin:    General: Skin is warm and dry.  Neurological:     General: No focal deficit present.     Mental Status: She is alert and oriented to person, place, and time.  Psychiatric:        Mood and Affect: Mood normal.        Behavior: Behavior normal.     No results found for this or any previous visit (from the past 24 hour(s)).  No results found.  Assessment/Plan: 44yo with dysmenorrhea, menorrhagia and desire for definitive management -Plan LAVH, bilateral salpingectomy -Patient is counseled re: risk of bleeding, infection, scarring and damage to surrounding structures.  She is informed of steps of the procedure as well as postop expectations.  All questions were answered and patient wishes to proceed.  Linda Hedges 02/05/2020, 1:17 PM

## 2020-02-10 ENCOUNTER — Other Ambulatory Visit (HOSPITAL_COMMUNITY)
Admission: RE | Admit: 2020-02-10 | Discharge: 2020-02-10 | Disposition: A | Discharge status: Home / Self Care | Payer: BC Managed Care – PPO | Source: Ambulatory Visit | Attending: Obstetrics & Gynecology | Admitting: Obstetrics & Gynecology

## 2020-02-10 DIAGNOSIS — Z01812 Encounter for preprocedural laboratory examination: Secondary | ICD-10-CM | POA: Diagnosis not present

## 2020-02-10 DIAGNOSIS — Z20822 Contact with and (suspected) exposure to covid-19: Secondary | ICD-10-CM | POA: Insufficient documentation

## 2020-02-10 LAB — SARS CORONAVIRUS 2 (TAT 6-24 HRS): SARS Coronavirus 2: NEGATIVE

## 2020-02-11 ENCOUNTER — Other Ambulatory Visit: Payer: Self-pay

## 2020-02-11 ENCOUNTER — Observation Stay (HOSPITAL_BASED_OUTPATIENT_CLINIC_OR_DEPARTMENT_OTHER): Payer: BC Managed Care – PPO | Admitting: Certified Registered Nurse Anesthetist

## 2020-02-11 ENCOUNTER — Observation Stay (HOSPITAL_BASED_OUTPATIENT_CLINIC_OR_DEPARTMENT_OTHER)
Admission: RE | Admit: 2020-02-11 | Discharge: 2020-02-12 | Disposition: A | Discharge status: Home / Self Care | Payer: BC Managed Care – PPO | Source: Other Acute Inpatient Hospital | Attending: Obstetrics & Gynecology | Admitting: Obstetrics & Gynecology

## 2020-02-11 ENCOUNTER — Encounter (HOSPITAL_BASED_OUTPATIENT_CLINIC_OR_DEPARTMENT_OTHER)
Admission: RE | Disposition: A | Discharge status: Home / Self Care | Payer: Self-pay | Source: Other Acute Inpatient Hospital | Attending: Obstetrics & Gynecology

## 2020-02-11 ENCOUNTER — Encounter (HOSPITAL_BASED_OUTPATIENT_CLINIC_OR_DEPARTMENT_OTHER): Payer: Self-pay | Admitting: Obstetrics & Gynecology

## 2020-02-11 DIAGNOSIS — I10 Essential (primary) hypertension: Secondary | ICD-10-CM | POA: Insufficient documentation

## 2020-02-11 DIAGNOSIS — F17210 Nicotine dependence, cigarettes, uncomplicated: Secondary | ICD-10-CM | POA: Insufficient documentation

## 2020-02-11 DIAGNOSIS — Z90710 Acquired absence of both cervix and uterus: Secondary | ICD-10-CM | POA: Diagnosis present

## 2020-02-11 DIAGNOSIS — R102 Pelvic and perineal pain: Secondary | ICD-10-CM | POA: Diagnosis not present

## 2020-02-11 DIAGNOSIS — Z85828 Personal history of other malignant neoplasm of skin: Secondary | ICD-10-CM | POA: Insufficient documentation

## 2020-02-11 DIAGNOSIS — N946 Dysmenorrhea, unspecified: Secondary | ICD-10-CM | POA: Diagnosis not present

## 2020-02-11 HISTORY — PX: LAPAROSCOPIC VAGINAL HYSTERECTOMY WITH SALPINGECTOMY: SHX6680

## 2020-02-11 LAB — ABO/RH: ABO/RH(D): A POS

## 2020-02-11 LAB — POCT PREGNANCY, URINE: Preg Test, Ur: NEGATIVE

## 2020-02-11 SURGERY — HYSTERECTOMY, VAGINAL, LAPAROSCOPY-ASSISTED, WITH SALPINGECTOMY
Anesthesia: General | Laterality: Bilateral

## 2020-02-11 MED ORDER — ROCURONIUM BROMIDE 10 MG/ML (PF) SYRINGE
PREFILLED_SYRINGE | INTRAVENOUS | Status: DC | PRN
  Administered 2020-02-11: 60 mg via INTRAVENOUS
  Administered 2020-02-11: 10 mg via INTRAVENOUS

## 2020-02-11 MED ORDER — ROCURONIUM BROMIDE 10 MG/ML (PF) SYRINGE
PREFILLED_SYRINGE | INTRAVENOUS | Status: AC
  Filled 2020-02-11: qty 10

## 2020-02-11 MED ORDER — DEXAMETHASONE SODIUM PHOSPHATE 10 MG/ML IJ SOLN
INTRAMUSCULAR | Status: AC
  Filled 2020-02-11: qty 1

## 2020-02-11 MED ORDER — HYDROMORPHONE HCL 1 MG/ML IJ SOLN
0.2500 mg | INTRAMUSCULAR | Status: DC | PRN
Start: 2020-02-11 — End: 2020-02-12
  Administered 2020-02-11: 0.25 mg via INTRAVENOUS

## 2020-02-11 MED ORDER — SERTRALINE HCL 100 MG PO TABS
100.0000 mg | ORAL_TABLET | Freq: Every day | ORAL | Status: DC
  Administered 2020-02-11: 100 mg via ORAL
  Filled 2020-02-11 (×2): qty 1

## 2020-02-11 MED ORDER — EPHEDRINE 5 MG/ML INJ
INTRAVENOUS | Status: AC
  Filled 2020-02-11: qty 10

## 2020-02-11 MED ORDER — PROPOFOL 10 MG/ML IV BOLUS
INTRAVENOUS | Status: DC | PRN
  Administered 2020-02-11: 120 mg via INTRAVENOUS

## 2020-02-11 MED ORDER — KETOROLAC TROMETHAMINE 30 MG/ML IJ SOLN
INTRAMUSCULAR | Status: AC
  Filled 2020-02-11: qty 1

## 2020-02-11 MED ORDER — CEFAZOLIN SODIUM-DEXTROSE 2-4 GM/100ML-% IV SOLN
INTRAVENOUS | Status: AC
  Filled 2020-02-11: qty 100

## 2020-02-11 MED ORDER — OXYCODONE-ACETAMINOPHEN 5-325 MG PO TABS
1.0000 | ORAL_TABLET | ORAL | Status: DC | PRN
  Administered 2020-02-11 – 2020-02-12 (×5): 2 via ORAL

## 2020-02-11 MED ORDER — CEFAZOLIN SODIUM-DEXTROSE 2-4 GM/100ML-% IV SOLN
2.0000 g | INTRAVENOUS | Status: AC
  Administered 2020-02-11: 2 g via INTRAVENOUS

## 2020-02-11 MED ORDER — ACETAMINOPHEN 500 MG PO TABS
1000.0000 mg | ORAL_TABLET | ORAL | Status: AC
  Administered 2020-02-11: 1000 mg via ORAL

## 2020-02-11 MED ORDER — MENTHOL 3 MG MT LOZG: 1.0000 | LOZENGE | OROMUCOSAL | Status: DC | PRN

## 2020-02-11 MED ORDER — ONDANSETRON HCL 4 MG PO TABS: 4.0000 mg | ORAL_TABLET | Freq: Four times a day (QID) | ORAL | Status: DC | PRN

## 2020-02-11 MED ORDER — HYDROMORPHONE HCL 1 MG/ML IJ SOLN
INTRAMUSCULAR | Status: AC
  Filled 2020-02-11: qty 1

## 2020-02-11 MED ORDER — ACETAMINOPHEN 500 MG PO TABS
ORAL_TABLET | ORAL | Status: AC
  Filled 2020-02-11: qty 2

## 2020-02-11 MED ORDER — HYDROMORPHONE HCL 1 MG/ML IJ SOLN: 0.2000 mg | INTRAMUSCULAR | Status: DC | PRN

## 2020-02-11 MED ORDER — PHENYLEPHRINE 40 MCG/ML (10ML) SYRINGE FOR IV PUSH (FOR BLOOD PRESSURE SUPPORT)
PREFILLED_SYRINGE | INTRAVENOUS | Status: AC
  Filled 2020-02-11: qty 10

## 2020-02-11 MED ORDER — PROPOFOL 10 MG/ML IV BOLUS
INTRAVENOUS | Status: AC
  Filled 2020-02-11: qty 20

## 2020-02-11 MED ORDER — DOCUSATE SODIUM 100 MG PO CAPS
ORAL_CAPSULE | ORAL | Status: AC
  Filled 2020-02-11: qty 1

## 2020-02-11 MED ORDER — ACETAMINOPHEN 325 MG PO TABS: 650.0000 mg | ORAL_TABLET | ORAL | Status: DC | PRN

## 2020-02-11 MED ORDER — DOCUSATE SODIUM 100 MG PO CAPS
100.0000 mg | ORAL_CAPSULE | Freq: Two times a day (BID) | ORAL | Status: DC
  Administered 2020-02-11 (×2): 100 mg via ORAL

## 2020-02-11 MED ORDER — LACTATED RINGERS IV SOLN: INTRAVENOUS | Status: DC

## 2020-02-11 MED ORDER — GABAPENTIN 300 MG PO CAPS
300.0000 mg | ORAL_CAPSULE | ORAL | Status: AC
  Administered 2020-02-11: 300 mg via ORAL

## 2020-02-11 MED ORDER — FENTANYL CITRATE (PF) 100 MCG/2ML IJ SOLN
25.0000 ug | INTRAMUSCULAR | Status: DC | PRN
Start: 2020-02-11 — End: 2020-02-12
  Administered 2020-02-11: 25 ug via INTRAVENOUS
  Administered 2020-02-11: 50 ug via INTRAVENOUS
  Administered 2020-02-11: 25 ug via INTRAVENOUS

## 2020-02-11 MED ORDER — FENTANYL CITRATE (PF) 100 MCG/2ML IJ SOLN
INTRAMUSCULAR | Status: DC | PRN
  Administered 2020-02-11: 100 ug via INTRAVENOUS
  Administered 2020-02-11: 50 ug via INTRAVENOUS

## 2020-02-11 MED ORDER — ONDANSETRON HCL 4 MG/2ML IJ SOLN
INTRAMUSCULAR | Status: AC
  Filled 2020-02-11: qty 2

## 2020-02-11 MED ORDER — FENTANYL CITRATE (PF) 250 MCG/5ML IJ SOLN
INTRAMUSCULAR | Status: AC
  Filled 2020-02-11: qty 5

## 2020-02-11 MED ORDER — LACTATED RINGERS IV SOLN
INTRAVENOUS | Status: DC
  Administered 2020-02-11: 125 mL/h via INTRAVENOUS

## 2020-02-11 MED ORDER — KETOROLAC TROMETHAMINE 30 MG/ML IJ SOLN
INTRAMUSCULAR | Status: DC | PRN
  Administered 2020-02-11: 30 mg via INTRAVENOUS

## 2020-02-11 MED ORDER — LIDOCAINE HCL (PF) 2 % IJ SOLN
INTRAMUSCULAR | Status: AC
  Filled 2020-02-11: qty 5

## 2020-02-11 MED ORDER — MIDAZOLAM HCL 2 MG/2ML IJ SOLN
INTRAMUSCULAR | Status: AC
  Filled 2020-02-11: qty 2

## 2020-02-11 MED ORDER — MIDAZOLAM HCL 5 MG/5ML IJ SOLN
INTRAMUSCULAR | Status: DC | PRN
  Administered 2020-02-11: 2 mg via INTRAVENOUS

## 2020-02-11 MED ORDER — ALUM & MAG HYDROXIDE-SIMETH 200-200-20 MG/5ML PO SUSP: 30.0000 mL | ORAL | Status: DC | PRN

## 2020-02-11 MED ORDER — FENTANYL CITRATE (PF) 100 MCG/2ML IJ SOLN
INTRAMUSCULAR | Status: AC
  Filled 2020-02-11: qty 2

## 2020-02-11 MED ORDER — SUGAMMADEX SODIUM 200 MG/2ML IV SOLN
INTRAVENOUS | Status: DC | PRN
  Administered 2020-02-11: 200 mg via INTRAVENOUS

## 2020-02-11 MED ORDER — KETAMINE HCL 10 MG/ML IJ SOLN
INTRAMUSCULAR | Status: AC
  Filled 2020-02-11: qty 1

## 2020-02-11 MED ORDER — KETAMINE HCL 10 MG/ML IJ SOLN
INTRAMUSCULAR | Status: DC | PRN
  Administered 2020-02-11: 10 mg via INTRAVENOUS
  Administered 2020-02-11: 20 mg via INTRAVENOUS

## 2020-02-11 MED ORDER — OXYCODONE-ACETAMINOPHEN 5-325 MG PO TABS
ORAL_TABLET | ORAL | Status: AC
  Filled 2020-02-11: qty 2

## 2020-02-11 MED ORDER — ONDANSETRON HCL 4 MG/2ML IJ SOLN: 4.0000 mg | Freq: Four times a day (QID) | INTRAMUSCULAR | Status: DC | PRN

## 2020-02-11 MED ORDER — SIMETHICONE 80 MG PO CHEW
CHEWABLE_TABLET | ORAL | Status: AC
  Filled 2020-02-11: qty 1

## 2020-02-11 MED ORDER — SODIUM CHLORIDE 0.9 % IR SOLN
Status: DC | PRN
  Administered 2020-02-11: 1000 mL

## 2020-02-11 MED ORDER — EPHEDRINE SULFATE-NACL 50-0.9 MG/10ML-% IV SOSY
PREFILLED_SYRINGE | INTRAVENOUS | Status: DC | PRN
  Administered 2020-02-11 (×2): 5 mg via INTRAVENOUS

## 2020-02-11 MED ORDER — BUPIVACAINE HCL (PF) 0.25 % IJ SOLN
INTRAMUSCULAR | Status: DC | PRN
  Administered 2020-02-11: 4 mL

## 2020-02-11 MED ORDER — PHENYLEPHRINE 40 MCG/ML (10ML) SYRINGE FOR IV PUSH (FOR BLOOD PRESSURE SUPPORT)
PREFILLED_SYRINGE | INTRAVENOUS | Status: DC | PRN
  Administered 2020-02-11: 80 ug via INTRAVENOUS
  Administered 2020-02-11: 40 ug via INTRAVENOUS
  Administered 2020-02-11 (×2): 80 ug via INTRAVENOUS
  Administered 2020-02-11: 40 ug via INTRAVENOUS

## 2020-02-11 MED ORDER — DEXAMETHASONE SODIUM PHOSPHATE 10 MG/ML IJ SOLN
INTRAMUSCULAR | Status: DC | PRN
  Administered 2020-02-11: 10 mg via INTRAVENOUS

## 2020-02-11 MED ORDER — KETOROLAC TROMETHAMINE 30 MG/ML IJ SOLN
30.0000 mg | Freq: Four times a day (QID) | INTRAMUSCULAR | Status: DC
  Administered 2020-02-11 – 2020-02-12 (×4): 30 mg via INTRAVENOUS

## 2020-02-11 MED ORDER — GABAPENTIN 300 MG PO CAPS
ORAL_CAPSULE | ORAL | Status: AC
  Filled 2020-02-11: qty 1

## 2020-02-11 MED ORDER — ONDANSETRON HCL 4 MG/2ML IJ SOLN
INTRAMUSCULAR | Status: DC | PRN
  Administered 2020-02-11: 4 mg via INTRAVENOUS

## 2020-02-11 MED ORDER — LIDOCAINE 2% (20 MG/ML) 5 ML SYRINGE
INTRAMUSCULAR | Status: DC | PRN
  Administered 2020-02-11: 60 mg via INTRAVENOUS

## 2020-02-11 MED ORDER — SIMETHICONE 80 MG PO CHEW
80.0000 mg | CHEWABLE_TABLET | Freq: Four times a day (QID) | ORAL | Status: DC | PRN
  Administered 2020-02-11: 80 mg via ORAL

## 2020-02-11 MED ORDER — POVIDONE-IODINE 10 % EX SWAB
2.0000 "application " | Freq: Once | CUTANEOUS | Status: AC
  Administered 2020-02-11: 2 via TOPICAL

## 2020-02-11 SURGICAL SUPPLY — 55 items
ADH SKN CLS APL DERMABOND .7 (GAUZE/BANDAGES/DRESSINGS) ×1
BLADE CLIPPER SENSICLIP SURGIC (BLADE) IMPLANT
CANISTER SUCT 3000ML PPV (MISCELLANEOUS) IMPLANT
CATH ROBINSON RED A/P 16FR (CATHETERS) ×2 IMPLANT
COVER BACK TABLE 60X90IN (DRAPES) ×2 IMPLANT
COVER MAYO STAND STRL (DRAPES) ×4 IMPLANT
COVER WAND RF STERILE (DRAPES) ×2 IMPLANT
DECANTER SPIKE VIAL GLASS SM (MISCELLANEOUS) IMPLANT
DERMABOND ADVANCED (GAUZE/BANDAGES/DRESSINGS) ×1
DERMABOND ADVANCED .7 DNX12 (GAUZE/BANDAGES/DRESSINGS) ×1 IMPLANT
DRSG COVADERM PLUS 2X2 (GAUZE/BANDAGES/DRESSINGS) IMPLANT
DRSG OPSITE POSTOP 3X4 (GAUZE/BANDAGES/DRESSINGS) ×2 IMPLANT
DURAPREP 26ML APPLICATOR (WOUND CARE) ×2 IMPLANT
ELECT REM PT RETURN 9FT ADLT (ELECTROSURGICAL) ×2
ELECTRODE REM PT RTRN 9FT ADLT (ELECTROSURGICAL) ×1 IMPLANT
GAUZE 4X4 16PLY RFD (DISPOSABLE) ×2 IMPLANT
GLOVE BIO SURGEON STRL SZ 6 (GLOVE) ×6 IMPLANT
GLOVE BIOGEL PI IND STRL 6 (GLOVE) ×2 IMPLANT
GLOVE BIOGEL PI INDICATOR 6 (GLOVE) ×2
GLOVE ECLIPSE 6.5 STRL STRAW (GLOVE) ×4 IMPLANT
HOLDER FOLEY CATH W/STRAP (MISCELLANEOUS) ×2 IMPLANT
IV NS 1000ML (IV SOLUTION)
IV NS 1000ML BAXH (IV SOLUTION) IMPLANT
IV NS IRRIG 3000ML ARTHROMATIC (IV SOLUTION) IMPLANT
KIT TURNOVER CYSTO (KITS) ×2 IMPLANT
LIGASURE IMPACT 36 18CM CVD LR (INSTRUMENTS) ×2 IMPLANT
LIGASURE LAP L-HOOKWIRE 5 44CM (INSTRUMENTS) ×2 IMPLANT
MANIFOLD NEPTUNE II (INSTRUMENTS) ×2 IMPLANT
MANIPULATOR UTERINE 4.5 ZUMI (MISCELLANEOUS) ×2 IMPLANT
NEEDLE INSUFFLATION 120MM (ENDOMECHANICALS) ×2 IMPLANT
NS IRRIG 500ML POUR BTL (IV SOLUTION) ×2 IMPLANT
PACK LAVH (CUSTOM PROCEDURE TRAY) ×2 IMPLANT
PACK ROBOTIC GOWN (GOWN DISPOSABLE) ×2 IMPLANT
PACK TRENDGUARD 450 HYBRID PRO (MISCELLANEOUS) IMPLANT
PAD OB MATERNITY 4.3X12.25 (PERSONAL CARE ITEMS) ×2 IMPLANT
PAD PREP 24X48 CUFFED NSTRL (MISCELLANEOUS) ×2 IMPLANT
SCISSORS LAP 5X35 DISP (ENDOMECHANICALS) IMPLANT
SET SUCTION IRRIG HYDROSURG (IRRIGATION / IRRIGATOR) IMPLANT
SET TUBE SMOKE EVAC HIGH FLOW (TUBING) ×2 IMPLANT
SUT MNCRL 0 MO-4 VIOLET 18 CR (SUTURE) ×2 IMPLANT
SUT MNCRL AB 3-0 PS2 18 (SUTURE) ×2 IMPLANT
SUT MON AB 2-0 CT1 36 (SUTURE) ×2 IMPLANT
SUT MONOCRYL 0 MO 4 18  CR/8 (SUTURE) ×4
SUT VIC AB 0 CT1 27 (SUTURE)
SUT VIC AB 0 CT1 27XCR 8 STRN (SUTURE) IMPLANT
SUT VICRYL 0 TIES 12 18 (SUTURE) ×2 IMPLANT
SUT VICRYL 0 UR6 27IN ABS (SUTURE) ×2 IMPLANT
SYR BULB IRRIG 60ML STRL (SYRINGE) ×2 IMPLANT
TOWEL OR 17X26 10 PK STRL BLUE (TOWEL DISPOSABLE) ×2 IMPLANT
TRAY FOLEY W/BAG SLVR 14FR LF (SET/KITS/TRAYS/PACK) ×2 IMPLANT
TRENDGUARD 450 HYBRID PRO PACK (MISCELLANEOUS)
TROCAR BLADELESS OPT 5 100 (ENDOMECHANICALS) ×2 IMPLANT
TROCAR XCEL NON-BLD 11X100MML (ENDOMECHANICALS) ×2 IMPLANT
WARMER LAPAROSCOPE (MISCELLANEOUS) ×2 IMPLANT
WATER STERILE IRR 500ML POUR (IV SOLUTION) IMPLANT

## 2020-02-11 NOTE — Anesthesia Preprocedure Evaluation (Addendum)
Anesthesia Evaluation  Patient identified by MRN, date of birth, ID band Patient awake    Reviewed: Allergy & Precautions, Patient's Chart, lab work & pertinent test results  Airway Mallampati: II  TM Distance: >3 FB Neck ROM: Full    Dental no notable dental hx.    Pulmonary neg pulmonary ROS, Current SmokerPatient did not abstain from smoking.,    Pulmonary exam normal breath sounds clear to auscultation       Cardiovascular hypertension, Pt. on medications negative cardio ROS Normal cardiovascular exam Rhythm:Regular Rate:Normal     Neuro/Psych  Headaches, PSYCHIATRIC DISORDERS Anxiety    GI/Hepatic negative GI ROS, Neg liver ROS,   Endo/Other  negative endocrine ROS  Renal/GU negative Renal ROS  negative genitourinary   Musculoskeletal negative musculoskeletal ROS (+)   Abdominal   Peds  Hematology negative hematology ROS (+)   Anesthesia Other Findings   Reproductive/Obstetrics                            Anesthesia Physical Anesthesia Plan  ASA: II  Anesthesia Plan: General   Post-op Pain Management:    Induction: Intravenous  PONV Risk Score and Plan: 2 and Midazolam, Dexamethasone and Ondansetron  Airway Management Planned: Oral ETT  Additional Equipment:   Intra-op Plan:   Post-operative Plan: Extubation in OR  Informed Consent: I have reviewed the patients History and Physical, chart, labs and discussed the procedure including the risks, benefits and alternatives for the proposed anesthesia with the patient or authorized representative who has indicated his/her understanding and acceptance.     Dental advisory given  Plan Discussed with: CRNA  Anesthesia Plan Comments:         Anesthesia Quick Evaluation

## 2020-02-11 NOTE — Progress Notes (Signed)
No change to H&P.  Karas Pickerill, DO 

## 2020-02-11 NOTE — Anesthesia Postprocedure Evaluation (Signed)
Anesthesia Post Note  Patient: KHRYSTYNA SCHWALM  Procedure(s) Performed: LAPAROSCOPIC ASSISTED VAGINAL HYSTERECTOMY WITH BILATERAL SALPINGECTOMY (Bilateral )     Patient location during evaluation: PACU Anesthesia Type: General Level of consciousness: awake and alert Pain management: pain level controlled Vital Signs Assessment: post-procedure vital signs reviewed and stable Respiratory status: spontaneous breathing, nonlabored ventilation, respiratory function stable and patient connected to nasal cannula oxygen Cardiovascular status: blood pressure returned to baseline and stable Postop Assessment: no apparent nausea or vomiting Anesthetic complications: no   No complications documented.  Last Vitals:  Vitals:   02/11/20 1000 02/11/20 1015  BP: 120/89 120/86  Pulse: 72 69  Resp: 12 11  Temp:  (!) 36.1 C  SpO2: 100% 98%    Last Pain:  Vitals:   02/11/20 1015  TempSrc:   PainSc: 7                  Hanley Woerner L Rasa Degrazia

## 2020-02-11 NOTE — Op Note (Signed)
PROCEDURE DATE: 02/11/2020  PREOPERATIVE DIAGNOSIS: Pelvic pain POSTOPERATIVE DIAGNOSIS: The same  PROCEDURE: Laparoscopic Assisted Vaginal Hysterectomy with Bilateral Salpingectomy SURGEON: Dr. Linda Hedges  ASSISTANT: Dr. Arvella Nigh INDICATIONS: 44 y.o. G2P2 with dysmenorrhea and pelvic pain desiring definitive surgical management. Risks of surgery were discussed with the patient including but not limited to: bleeding which may require transfusion or reoperation; infection which may require antibiotics; injury to bowel, bladder, ureters or other surrounding organs; need for additional procedures including laparotomy; thromboembolic phenomenon, incisional problems and other postoperative/anesthesia complications. Written informed consent was obtained.  FINDINGS: Small uterus, normal adnexa bilaterally. No evidence of endometriosis. Normal upper abdomen.  ANESTHESIA: General  ESTIMATED BLOOD LOSS: 20 ml  SPECIMENS: Uterus, cervix, and bilateral fallopian tubes  COMPLICATIONS: None immediate  PROCEDURE IN DETAIL: The patient received intravenous antibiotics and had sequential compression devices applied to her lower extremities while in the preoperative area. She was then taken to the operating room where general anesthesia was administered and was found to be adequate. She was placed in the dorsal lithotomy position, and was prepped and draped in a sterile manner. An in and out catheterization was performed. A uterine manipulator was then advanced into the uterus . After an adequate timeout was performed, attention was then turned to the patient's abdomen where a 10-mm skin incision was made in the umbilical fold. The Veress needle was carefully introduced into the peritoneal cavity through the abdominal wall. Intraperitoneal placement was confirmed by drop in intraabdominal pressure with insufflation of carbon dioxide gas. Adequate pneumoperitoneum was obtained, and the 10/11 XL trocar and sleeve were  then advanced without difficulty into the abdomen where intraabdominal placement was confirmed by the laparoscope. A survey of the patient's pelvis and abdomen revealed entirely normal anatomy. Suprapubic 5 XL port was then placed under direct visualization. The pelvis was then carefully examined. On the right side, the fallopian tube was elevated and freed from the mesosalpinx using Ligasure.  The round ligament was then clamped and transected. The uteroovarian ligament was also clamped and transected. The leaves of the broad ligament were separated and serially transected. These procedures were then repeated on the left side. The ureters were noted to be safely away from the area of dissection.  At this point, attention was turned to the vaginal portion of the case. A weighted speculum was placed posteriorly, a Deaver anteriorly, and the cervix grasped with a thyroid tenaculum. Once the anterior and posterior reflections were identified, the cervix was circumscribed using the Bovie knife. Next, using Mayos, the posterior cul-de-sac was entered. The uterosacrals were clamped, cut and tied with Haney stitch bilaterally. Uterosacrals were tagged.   Next, the bladder reflection was identified. Using Metzenbaums, it was entered and palpation and direct visualization confirmed proper location. Next, using the LigaSure, the uterine arteries were coapted and cut bilaterally. The pedicles were visualized after coaptation and were hemostatic. The same was performed sequentially cephalad until the uterus, cervix, and fallopian tubes were removed. The pedicles were inspected and found to be hemostatic.  The posterior cuff was reapproximated using running locked stitch. The tagged uterosacrals were tied together in the midline.  The remainder of the cuff was closed using figure of eight 2-0 monocryl stitches. The cuff was inspected and found to be hemostatic. Foley catheter was placed and drained clear, yellow  urine. Attention was returned to the abdomen were a second laparoscopic look was taken. All pedicles were hemostatic. Insufflation was removed after all instruments were removed. Infraumbilical fascia was  closed with 0 vicryl figure of eight stitch.  All skin incisions were closed with 3-0 monocryl subcuticular stitches and Dermabond. The patient tolerated the procedures well. All instruments, needles, and sponge counts were correct x 2. The patient was taken to the recovery room awake, extubated and in stable condition.

## 2020-02-11 NOTE — Progress Notes (Signed)
NOS  No c/o.  Adequate pain control.  No N/V.  No CP/SOB.  Tolerating diet.  VSS. AF. UOP adequate  Gen: A&O x 3 Abd: soft, dressing c/d/i Ext: No c/c/e.  SCDs on.  POD#0 s/p LAVH, bilateral salpingectomy -Advance diet -Ambulate -AM labs pending -D/C foley and IVF in AM -Likely d/c home tomorrow  Mitchel Honour, DO

## 2020-02-11 NOTE — Transfer of Care (Signed)
Immediate Anesthesia Transfer of Care Note  Patient: Linda Ford  Procedure(s) Performed: LAPAROSCOPIC ASSISTED VAGINAL HYSTERECTOMY WITH BILATERAL SALPINGECTOMY (Bilateral )  Patient Location: PACU  Anesthesia Type:General  Level of Consciousness: awake, alert , oriented and patient cooperative  Airway & Oxygen Therapy: Patient Spontanous Breathing and Patient connected to nasal cannula oxygen  Post-op Assessment: Report given to RN and Post -op Vital signs reviewed and stable  Post vital signs: Reviewed and stable  Last Vitals:  Vitals Value Taken Time  BP    Temp    Pulse 81 02/11/20 0907  Resp 12 02/11/20 0907  SpO2 100 % 02/11/20 0907  Vitals shown include unvalidated device data.  Last Pain:  Vitals:   02/11/20 0556  TempSrc: Oral  PainSc: 0-No pain      Patients Stated Pain Goal: 4 (02/11/20 0556)  Complications: No complications documented.

## 2020-02-11 NOTE — Anesthesia Procedure Notes (Signed)
Procedure Name: Intubation Date/Time: 02/11/2020 7:26 AM Performed by: Rogers Blocker, CRNA Pre-anesthesia Checklist: Patient identified, Emergency Drugs available, Suction available and Patient being monitored Patient Re-evaluated:Patient Re-evaluated prior to induction Oxygen Delivery Method: Circle System Utilized Preoxygenation: Pre-oxygenation with 100% oxygen Induction Type: IV induction Ventilation: Mask ventilation without difficulty Laryngoscope Size: Mac and 3 Grade View: Grade I Tube type: Oral Number of attempts: 1 Airway Equipment and Method: Stylet Placement Confirmation: ETT inserted through vocal cords under direct vision,  positive ETCO2 and breath sounds checked- equal and bilateral Secured at: 22 cm Tube secured with: Tape Dental Injury: Teeth and Oropharynx as per pre-operative assessment

## 2020-02-12 DIAGNOSIS — R102 Pelvic and perineal pain: Secondary | ICD-10-CM | POA: Diagnosis not present

## 2020-02-12 LAB — COMPREHENSIVE METABOLIC PANEL
ALT: 14 U/L (ref 0–44)
AST: 25 U/L (ref 15–41)
Albumin: 3.5 g/dL (ref 3.5–5.0)
Alkaline Phosphatase: 25 U/L — ABNORMAL LOW (ref 38–126)
Anion gap: 8 (ref 5–15)
BUN: 10 mg/dL (ref 6–20)
CO2: 27 mmol/L (ref 22–32)
Calcium: 8.8 mg/dL — ABNORMAL LOW (ref 8.9–10.3)
Chloride: 105 mmol/L (ref 98–111)
Creatinine, Ser: 0.69 mg/dL (ref 0.44–1.00)
GFR, Estimated: >60 mL/min (ref >60)
Glucose, Bld: 103 mg/dL — ABNORMAL HIGH (ref 70–99)
Potassium: 4.3 mmol/L (ref 3.5–5.1)
Sodium: 140 mmol/L (ref 135–145)
Total Bilirubin: 0.4 mg/dL (ref 0.3–1.2)
Total Protein: 5.8 g/dL — ABNORMAL LOW (ref 6.5–8.1)

## 2020-02-12 LAB — CBC
HCT: 34.8 % — ABNORMAL LOW (ref 36.0–46.0)
Hemoglobin: 11.5 g/dL — ABNORMAL LOW (ref 12.0–15.0)
MCH: 32.3 pg (ref 26.0–34.0)
MCHC: 33 g/dL (ref 30.0–36.0)
MCV: 97.8 fL (ref 80.0–100.0)
Platelets: 154 10*3/uL (ref 150–400)
RBC: 3.56 MIL/uL — ABNORMAL LOW (ref 3.87–5.11)
RDW: 12.4 % (ref 11.5–15.5)
WBC: 10.8 10*3/uL — ABNORMAL HIGH (ref 4.0–10.5)
nRBC: 0 % (ref 0.0–0.2)

## 2020-02-12 LAB — SURGICAL PATHOLOGY

## 2020-02-12 MED ORDER — OXYCODONE-ACETAMINOPHEN 5-325 MG PO TABS
ORAL_TABLET | ORAL | Status: AC
  Filled 2020-02-12: qty 2

## 2020-02-12 MED ORDER — KETOROLAC TROMETHAMINE 30 MG/ML IJ SOLN
INTRAMUSCULAR | Status: AC
  Filled 2020-02-12: qty 1

## 2020-02-12 MED ORDER — OXYCODONE-ACETAMINOPHEN 5-325 MG PO TABS: 1.0000 | ORAL_TABLET | ORAL | 0 refills | Status: AC | PRN

## 2020-02-12 MED ORDER — IBUPROFEN 800 MG PO TABS: 800.0000 mg | ORAL_TABLET | Freq: Four times a day (QID) | ORAL | Status: AC

## 2020-02-12 NOTE — Discharge Instructions (Signed)
Laparoscopically Assisted Vaginal Hysterectomy, Care After This sheet gives you information about how to care for yourself after your procedure. Your health care provider may also give you more specific instructions. If you have problems or questions, contact your health care provider. What can I expect after the procedure? After the procedure, it is common to have:  Soreness and numbness in your incision areas.  Abdominal pain. You will be given pain medicine to control it.  Vaginal bleeding and discharge. You will need to use a sanitary napkin after this procedure.  Sore throat from the breathing tube that was inserted during surgery. Follow these instructions at home: Medicines  Take over-the-counter and prescription medicines only as told by your health care provider.  Do not take aspirin or ibuprofen. These medicines can cause bleeding.  Do not drive or use heavy machinery while taking prescription pain medicine.  Do not drive for 24 hours if you were given a medicine to help you relax (sedative) during the procedure. Incision care   Follow instructions from your health care provider about how to take care of your incisions. Make sure you: ? Wash your hands with soap and water before you change your bandage (dressing). If soap and water are not available, use hand sanitizer. ? Change your dressing as told by your health care provider. ? Leave stitches (sutures), skin glue, or adhesive strips in place. These skin closures may need to stay in place for 2 weeks or longer. If adhesive strip edges start to loosen and curl up, you may trim the loose edges. Do not remove adhesive strips completely unless your health care provider tells you to do that.  Check your incision area every day for signs of infection. Check for: ? Redness, swelling, or pain. ? Fluid or blood. ? Warmth. ? Pus or a bad smell. Activity  Get regular exercise as told by your health care provider. You may be  told to take short walks every day and go farther each time.  Return to your normal activities as told by your health care provider. Ask your health care provider what activities are safe for you.  Do not douche, use tampons, or have sexual intercourse for at least 6 weeks, or until your health care provider gives you permission.  Do not lift anything that is heavier than 10 lb (4.5 kg), or the limit that your health care provider tells you, until he or she says that it is safe. General instructions  Do not take baths, swim, or use a hot tub until your health care provider approves. Take showers instead of baths.  Do not drive for 24 hours if you received a sedative.  Do not drive or operate heavy machinery while taking prescription pain medicine.  To prevent or treat constipation while you are taking prescription pain medicine, your health care provider may recommend that you: ? Drink enough fluid to keep your urine clear or pale yellow. ? Take over-the-counter or prescription medicines. ? Eat foods that are high in fiber, such as fresh fruits and vegetables, whole grains, and beans. ? Limit foods that are high in fat and processed sugars, such as fried and sweet foods.  Keep all follow-up visits as told by your health care provider. This is important. Contact a health care provider if:  You have signs of infection, such as: ? Redness, swelling, or pain around your incision sites. ? Fluid or blood coming from an incision. ? An incision that feels warm to the   touch. ? Pus or a bad smell coming from an incision.  Your incision breaks open.  Your pain medicine is not helping.  You feel dizzy or light-headed.  You have pain or bleeding when you urinate.  You have persistent nausea and vomiting.  You have blood, pus, or a bad-smelling discharge from your vagina. Get help right away if:  You have a fever.  You have severe abdominal pain.  You have chest pain.  You have  shortness of breath.  You faint.  You have pain, swelling, or redness in your leg.  You have heavy bleeding from your vagina. Summary  After the procedure, it is common to have abdominal pain and vaginal bleeding.  You should not drive or lift heavy objects until your health care provider says that it is safe.  Contact your health care provider if you have any symptoms of infection, excessive vaginal bleeding, nausea, vomiting, or shortness of breath. This information is not intended to replace advice given to you by your health care provider. Make sure you discuss any questions you have with your health care provider. Document Revised: 01/13/2017 Document Reviewed: 03/29/2016 Elsevier Patient Education  2020 [employer]. Call MD for T>100.4, heavy vaginal bleeding, severe abdominal pain, intractable nausea and/or vomiting, or respiratory distress.  Call office to schedule postop appointment in 2 weeks.  Pelvic rest x 6 weeks.  No driving while taking narcotics.

## 2020-02-12 NOTE — Discharge Summary (Signed)
Physician Discharge Summary  Patient ID: CHESNEE FLOREN MRN: [id number] DOB/AGE: Oct 18, 1975 44 y.o.  Admit date: 02/11/2020 Discharge date: 02/12/2020  Admission Diagnoses: Pelvic pain and dysmenorrhea  Discharge Diagnoses: SAA Active Problems:   Pelvic pain   S/P laparoscopic assisted vaginal hysterectomy (LAVH)   Discharged Condition: good  Hospital Course: Patient was admitted for anticipated LAVH, bilateral salpingectomy.  She underwent the anticipated procedure without complications.  See operative note for details.  Patient did well postoperatively and on POD#1 was meeting all goals.  She was eating full diet, ambulating well, voiding without difficulty, passing flatus, had excellent pain control.  She was discharged home on POD#1 with anticipate follow up in office 2 weeks postop.  Consults: None  Significant Diagnostic Studies: none  Treatments: surgery: LAVH, bilateral salpingectomy  Discharge Exam: Blood pressure (!) 135/92, pulse 61, temperature 98.5 F (36.9 C), resp. rate 12, height 5' 2.5" (1.588 m), weight 64.7 kg, SpO2 99 %. General appearance: alert, cooperative and appears stated age Extremities: extremities normal, atraumatic, no cyanosis or edema Abd: soft, non-distended, appropriately ttp.  Incisions c/d/i x 2  Disposition: Discharge disposition: 01-Home or Self Care       Discharge Instructions    Discharge patient   Complete by: As directed    Discharge disposition: 01-Home or Self Care   Discharge patient date: 02/12/2020     Allergies as of 02/12/2020      Reactions   Macrobid [nitrofurantoin] Hives      Medication List    STOP taking these medications   ibuprofen 200 MG tablet Commonly known as: ADVIL   norethindrone 0.35 MG tablet Commonly known as: MICRONOR     TAKE these medications   alclomethasone 0.05 % cream Commonly known as: ACLOVATE Apply topically 2 (two) times daily as needed (Rash).   cetirizine 10 MG  tablet Commonly known as: ZYRTEC Take 10 mg by mouth daily.   lisinopril 40 MG tablet Commonly known as: ZESTRIL Take 40 mg by mouth daily.   mupirocin ointment 2 % Commonly known as: BACTROBAN Apply 1 application topically daily as needed (mix with ACLOVATE).   oxyCODONE-acetaminophen 5-325 MG tablet Commonly known as: PERCOCET/ROXICET Take 1 tablet by mouth every 4 (four) hours as needed for moderate pain.   sertraline 100 MG tablet Commonly known as: ZOLOFT Take 1 tablet (100 mg total) by mouth daily. What changed: how much to take   valACYclovir 1000 MG tablet Commonly known as: VALTREX TAKE 2 TABLETS BY MOUTH TWICE A DAY AT FEVER BLISTER ONSET What changed: See the new instructions.        Signed: Mitchel Honour 02/12/2020, 7:58 AM

## 2020-02-13 ENCOUNTER — Encounter (HOSPITAL_BASED_OUTPATIENT_CLINIC_OR_DEPARTMENT_OTHER): Payer: Self-pay | Admitting: Obstetrics & Gynecology

## 2020-11-10 ENCOUNTER — Emergency Department (HOSPITAL_BASED_OUTPATIENT_CLINIC_OR_DEPARTMENT_OTHER): Payer: BC Managed Care – PPO

## 2020-11-10 ENCOUNTER — Emergency Department (HOSPITAL_BASED_OUTPATIENT_CLINIC_OR_DEPARTMENT_OTHER)
Admission: EM | Admit: 2020-11-10 | Discharge: 2020-11-10 | Disposition: A | Discharge status: Home / Self Care | Payer: BC Managed Care – PPO | Attending: Emergency Medicine | Admitting: Emergency Medicine

## 2020-11-10 ENCOUNTER — Encounter (HOSPITAL_BASED_OUTPATIENT_CLINIC_OR_DEPARTMENT_OTHER): Payer: Self-pay | Admitting: Emergency Medicine

## 2020-11-10 ENCOUNTER — Other Ambulatory Visit: Payer: Self-pay

## 2020-11-10 DIAGNOSIS — Z85828 Personal history of other malignant neoplasm of skin: Secondary | ICD-10-CM | POA: Insufficient documentation

## 2020-11-10 DIAGNOSIS — I82612 Acute embolism and thrombosis of superficial veins of left upper extremity: Secondary | ICD-10-CM | POA: Diagnosis not present

## 2020-11-10 DIAGNOSIS — F17210 Nicotine dependence, cigarettes, uncomplicated: Secondary | ICD-10-CM | POA: Insufficient documentation

## 2020-11-10 DIAGNOSIS — I10 Essential (primary) hypertension: Secondary | ICD-10-CM | POA: Insufficient documentation

## 2020-11-10 DIAGNOSIS — I808 Phlebitis and thrombophlebitis of other sites: Secondary | ICD-10-CM

## 2020-11-10 DIAGNOSIS — R609 Edema, unspecified: Secondary | ICD-10-CM

## 2020-11-10 DIAGNOSIS — M79602 Pain in left arm: Secondary | ICD-10-CM | POA: Diagnosis present

## 2020-11-10 DIAGNOSIS — Z79899 Other long term (current) drug therapy: Secondary | ICD-10-CM | POA: Diagnosis not present

## 2020-11-10 NOTE — ED Triage Notes (Signed)
Pt arrives to ED with c/o of left arm pain x5 days ago. Above her AC vein she has swelling and and tenderness. Pt reports having an IV in place in her [facility] while she was having surgery at [place] two weeks ago.

## 2020-11-10 NOTE — ED Notes (Signed)
This RN presented the AVS utilizing Teachback Method. Patient verbalizes understanding of Discharge Instructions. Opportunity for Questioning and Answers were provided. Patient Discharged from ED ambulatory to Home via SELF.

## 2020-11-10 NOTE — ED Provider Notes (Signed)
[facility] EMERGENCY DEPT Provider Note   CSN: [id number] Arrival date & time: 11/10/20  1752     History Chief Complaint  Patient presents with   Arm Pain    Linda Ford is a 45 y.o. female.  This is a 45 y.o. female with significant medical history as below, including HTN, migraine who presents to the ED with complaint of left arm pain/swelling.  Location:  left upper arm Duration:  last few days Onset:  gradual Timing:  constant Description:  mild pain to LUE, pressure, swelling Severity:  mild Exacerbating/Alleviating Factors:  worse with direct palpation, improved with rest, motrin Associated Symptoms:  none Pertinent Negatives:  no fevers, chills, numbness, fevers, no chest pain or dyspnea.    The history is provided by the patient. No language interpreter was used.  Arm Pain Pertinent negatives include no chest pain, no abdominal pain, no headaches and no shortness of breath.      Past Medical History:  Diagnosis Date   Anxiety    Cancer ([place]) 04/05   basal cell, face and legs   Family history of adverse reaction to anesthesia    mother- N/V    Hypertension    Migraine without aura    pt denies     Patient Active Problem List   Diagnosis Date Noted   Pelvic pain 02/11/2020   S/P laparoscopic assisted vaginal hysterectomy (LAVH) 02/11/2020   Essential hypertension, benign 05/01/2013   Migraine without aura 05/01/2013   Basal cell carcinoma 05/01/2013    Past Surgical History:  Procedure Laterality Date   LAPAROSCOPIC VAGINAL HYSTERECTOMY WITH SALPINGECTOMY Bilateral 02/11/2020   Procedure: LAPAROSCOPIC ASSISTED VAGINAL HYSTERECTOMY WITH BILATERAL SALPINGECTOMY;  Surgeon: Linda Hedges, DO;  Location: [place];  Service: Gynecology;  Laterality: Bilateral;   NOVASURE ABLATION  04/18/2012   REFRACTIVE SURGERY Bilateral 2013   TONSILLECTOMY AND ADENOIDECTOMY  12/31/02     OB History     Gravida  2   Para       Term      Preterm      AB      Living  2      SAB      IAB      Ectopic      Multiple      Live Births              Family History  Problem Relation Age of Onset   Heart disease Father     Social History   Tobacco Use   Smoking status: Every Day    Packs/day: 0.25    Types: Cigarettes   Smokeless tobacco: Never  Vaping Use   Vaping Use: Every day   Substances: Nicotine  Substance Use Topics   Alcohol use: Yes    Alcohol/week: 1.0 - 2.0 standard drink    Types: 1 - 2 Standard drinks or equivalent per week    Comment: occ wine   Drug use: No    Home Medications Prior to Admission medications   Medication Sig Start Date End Date Taking? Authorizing Provider  alclomethasone (ACLOVATE) 0.05 % cream Apply topically 2 (two) times daily as needed (Rash). 07/30/19   Sheffield, Ronalee Red, PA-C  cetirizine (ZYRTEC) 10 MG tablet Take 10 mg by mouth daily.    [provider]  lisinopril (PRINIVIL,ZESTRIL) 40 MG tablet Take 40 mg by mouth daily.  04/13/13   [provider]  mupirocin ointment (BACTROBAN) 2 % Apply 1 application  topically daily as needed (mix with ACLOVATE).    [provider]  oxyCODONE-acetaminophen (PERCOCET/ROXICET) 5-325 MG tablet Take 1 tablet by mouth every 4 (four) hours as needed for moderate pain. 02/12/20   Morris, Jinny Blossom, DO  sertraline (ZOLOFT) 100 MG tablet Take 1 tablet (100 mg total) by mouth daily. Patient taking differently: Take 150 mg by mouth daily.    Elveria Rising, MD  valACYclovir (VALTREX) 1000 MG tablet TAKE 2 TABLETS BY MOUTH TWICE A DAY AT FEVER BLISTER ONSET Patient taking differently: Take 1,000 mg by mouth 2 (two) times daily as needed (fever blisters). 07/26/12   Chevis Pretty, [place] [nitrofurantoin]  Review of Systems   Review of Systems  Constitutional:  Negative for chills and fever.  HENT:  Negative for facial swelling and trouble swallowing.   Eyes:   Negative for photophobia and visual disturbance.  Respiratory:  Negative for cough and shortness of breath.   Cardiovascular:  Negative for chest pain and palpitations.  Gastrointestinal:  Negative for abdominal pain, nausea and vomiting.  Endocrine: Negative for polydipsia and polyuria.  Genitourinary:  Negative for difficulty urinating and hematuria.  Musculoskeletal:  Negative for gait problem and joint swelling.       Left arm swelling  Skin:  Negative for pallor and rash.  Neurological:  Negative for syncope and headaches.  Psychiatric/Behavioral:  Negative for agitation and confusion.    Physical Exam Updated Vital Signs BP 119/77 (BP Location: Left Arm)   Pulse 79   Temp 98.8 F (37.1 C)   Resp 18   Ht 5\' 2"  (1.575 m)   Wt 63.5 kg   LMP 09/05/2019   SpO2 98%   BMI 25.61 kg/m   Physical Exam Vitals and nursing note reviewed.  Constitutional:      General: She is not in acute distress.    Appearance: Normal appearance.  HENT:     Head: Normocephalic and atraumatic.     Right Ear: External ear normal.     Left Ear: External ear normal.     Nose: Nose normal.     Mouth/Throat:     Mouth: Mucous membranes are moist.  Eyes:     General: No scleral icterus.       Right eye: No discharge.        Left eye: No discharge.  Cardiovascular:     Rate and Rhythm: Normal rate and regular rhythm.     Pulses: Normal pulses.          Radial pulses are 2+ on the right side and 2+ on the left side.       Posterior tibial pulses are 2+ on the right side and 2+ on the left side.     Heart sounds: Normal heart sounds.  Pulmonary:     Effort: Pulmonary effort is normal. No respiratory distress.     Breath sounds: Normal breath sounds.  Abdominal:     General: Abdomen is flat.     Tenderness: There is no abdominal tenderness.  Musculoskeletal:        General: Normal range of motion.     Cervical back: Normal range of motion.     Right lower leg: No edema.     Left lower leg: No  edema.  Skin:    General: Skin is warm and dry.     Capillary Refill: Capillary refill takes less than 2 seconds.  Neurological:     Mental Status:  She is alert and oriented to person, place, and time.     GCS: GCS eye subscore is 4. GCS verbal subscore is 5. GCS motor subscore is 6.     Sensory: Sensation is intact. No sensory deficit.     Motor: Motor function is intact. No weakness.  Psychiatric:        Mood and Affect: Mood normal.        Behavior: Behavior normal.    ED Results / Procedures / Treatments   Labs (all labs ordered are listed, but only abnormal results are displayed) Labs Reviewed - No data to display  EKG None  Radiology US Venous Img Upper Uni Left (DVT)  Result Date: 11/10/2020 CLINICAL DATA:  Upper arm pain EXAM: Left UPPER EXTREMITY VENOUS DOPPLER ULTRASOUND TECHNIQUE: Laydon Martis-scale sonography with graded compression, as well as color Doppler and duplex ultrasound were performed to evaluate the upper extremity deep venous system from the level of the subclavian vein and including the jugular, axillary, basilic, radial, ulnar and upper cephalic vein. Spectral Doppler was utilized to evaluate flow at rest and with distal augmentation maneuvers. COMPARISON:  None. FINDINGS: Contralateral Subclavian Vein: Respiratory phasicity is normal and symmetric with the symptomatic side. No evidence of thrombus. Normal compressibility. Internal Jugular Vein: No evidence of thrombus. Normal compressibility, respiratory phasicity and response to augmentation. Subclavian Vein: No evidence of thrombus. Normal compressibility, respiratory phasicity and response to augmentation. Axillary Vein: No evidence of thrombus. Normal compressibility, respiratory phasicity and response to augmentation. Cephalic Vein: No evidence of thrombus. Normal compressibility, respiratory phasicity and response to augmentation. Basilic Vein: Positive for occlusive thrombus extending from the proximal upper arm to  the antecubital fossa. Brachial Veins: No evidence of thrombus. Normal compressibility, respiratory phasicity and response to augmentation. Radial Veins: No evidence of thrombus. Normal compressibility, respiratory phasicity and response to augmentation. Ulnar Veins: No evidence of thrombus. Normal compressibility, respiratory phasicity and response to augmentation. IMPRESSION: 1. Negative for acute DVT within the left upper extremity. 2. Positive for acute occlusive thrombus/superficial thrombophlebitis involving the basilic vein from the proximal upper arm to the antecubital fossa Electronically Signed   By: Donavan Foil M.D.   On: 11/10/2020 20:16    Procedures Procedures   Medications Ordered in ED Medications - No data to display  ED Course  I have reviewed the triage vital signs and the nursing notes.  Pertinent labs & imaging results that were available during my care of the patient were reviewed by me and considered in my medical decision making (see chart for details).    MDM Rules/Calculators/A&P                         This patient complains of left arm swelling; this involves an extensive number of treatment Options and is a complaint that carries with it a high risk of complications and Morbidity. Vital signs reviewed and are stable. Serious etiologies considered.    I ordered imaging studies which included LUE vascular U/S and I independently    visualized and interpreted imaging which showed superficial thrombophlebitis/thrombus of basilic vein  Previous records obtained and reviewed   Patient overall feeling well. LUE is neurovascularly intact. No significant pain on palpation. She has likely SVT, no evidence of DVT. No signs of cellulitis on exam to LUE. She is not high risk for DVT. Favor SVT was provoked by recent IV access. Discussed supportive care regarding this ailment including compression, warm compress, elevation. Hold off  on [facility] for now given she has very mild  symptoms and low risk for dvt.  The patient improved significantly and was discharged in stable condition. Detailed discussions were had with the patient regarding current findings, and need for close f/u with PCP or on call doctor. The patient has been instructed to return immediately if the symptoms worsen in any way for re-evaluation. Patient verbalized understanding and is in agreement with current care plan. All questions answered prior to discharge.    Final Clinical Impression(s) / ED Diagnoses Final diagnoses:  Superficial thrombophlebitis of left upper extremity    Rx / DC Orders ED Discharge Orders     None        Jeanell Sparrow, DO 11/11/20 2028

## 2021-05-04 ENCOUNTER — Encounter (INDEPENDENT_AMBULATORY_CARE_PROVIDER_SITE_OTHER): Payer: BC Managed Care – PPO | Admitting: Ophthalmology

## 2021-05-04 ENCOUNTER — Other Ambulatory Visit: Payer: Self-pay

## 2021-05-04 DIAGNOSIS — H43812 Vitreous degeneration, left eye: Secondary | ICD-10-CM | POA: Diagnosis not present

## 2021-05-04 DIAGNOSIS — H35033 Hypertensive retinopathy, bilateral: Secondary | ICD-10-CM | POA: Diagnosis not present

## 2021-05-04 DIAGNOSIS — I10 Essential (primary) hypertension: Secondary | ICD-10-CM | POA: Diagnosis not present

## 2021-06-15 ENCOUNTER — Encounter (INDEPENDENT_AMBULATORY_CARE_PROVIDER_SITE_OTHER): Payer: BC Managed Care – PPO | Admitting: Ophthalmology

## 2021-06-15 DIAGNOSIS — H2513 Age-related nuclear cataract, bilateral: Secondary | ICD-10-CM | POA: Diagnosis not present

## 2021-06-15 DIAGNOSIS — H35033 Hypertensive retinopathy, bilateral: Secondary | ICD-10-CM | POA: Diagnosis not present

## 2021-06-15 DIAGNOSIS — I10 Essential (primary) hypertension: Secondary | ICD-10-CM

## 2021-06-15 DIAGNOSIS — H43812 Vitreous degeneration, left eye: Secondary | ICD-10-CM

## 2021-12-20 ENCOUNTER — Encounter (INDEPENDENT_AMBULATORY_CARE_PROVIDER_SITE_OTHER): Payer: BC Managed Care – PPO | Admitting: Ophthalmology

## 2021-12-20 DIAGNOSIS — H35033 Hypertensive retinopathy, bilateral: Secondary | ICD-10-CM

## 2021-12-20 DIAGNOSIS — H43813 Vitreous degeneration, bilateral: Secondary | ICD-10-CM

## 2021-12-20 DIAGNOSIS — I10 Essential (primary) hypertension: Secondary | ICD-10-CM

## 2023-03-22 IMAGING — US US EXTREM  UP VENOUS*L*
1 series · 8 of 8 positions shown · non-contrast
Comparison: None.

CLINICAL DATA: Upper arm pain



[Series 1: us venous img upper uni left (dvt) · portal-venous · 8 of 8 slices shown]
[im 1/8]
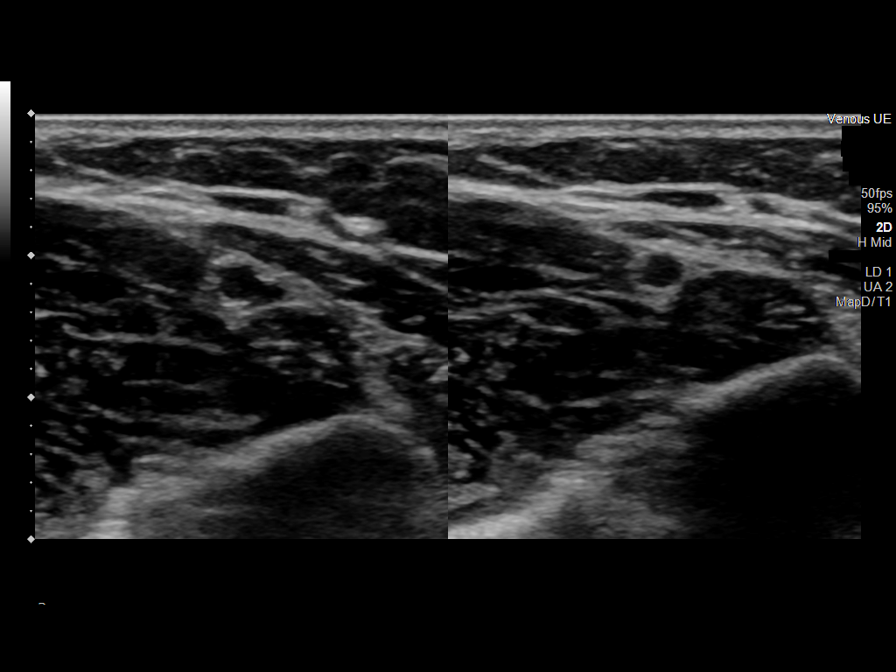
[im 2/8]
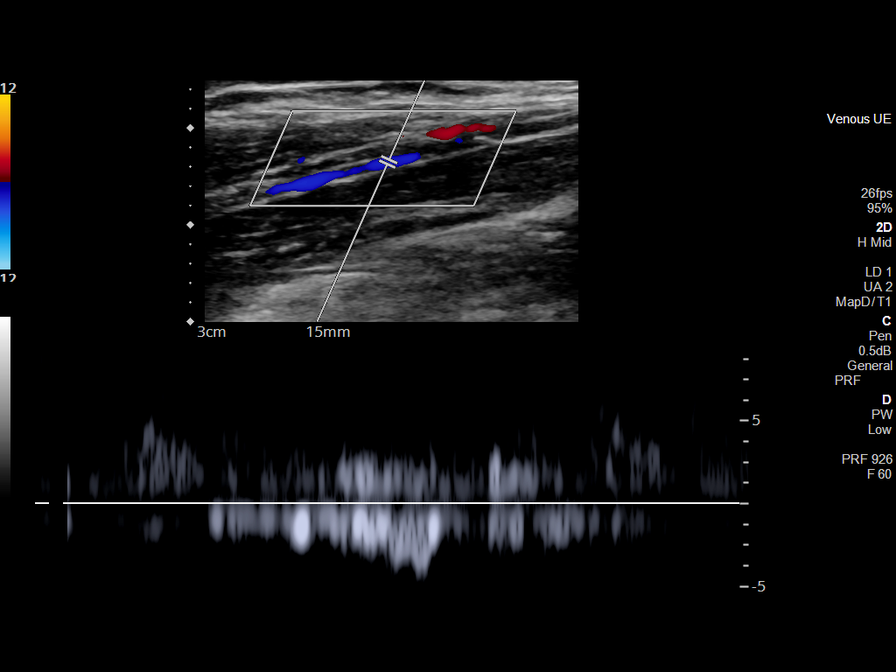
[im 3/8]
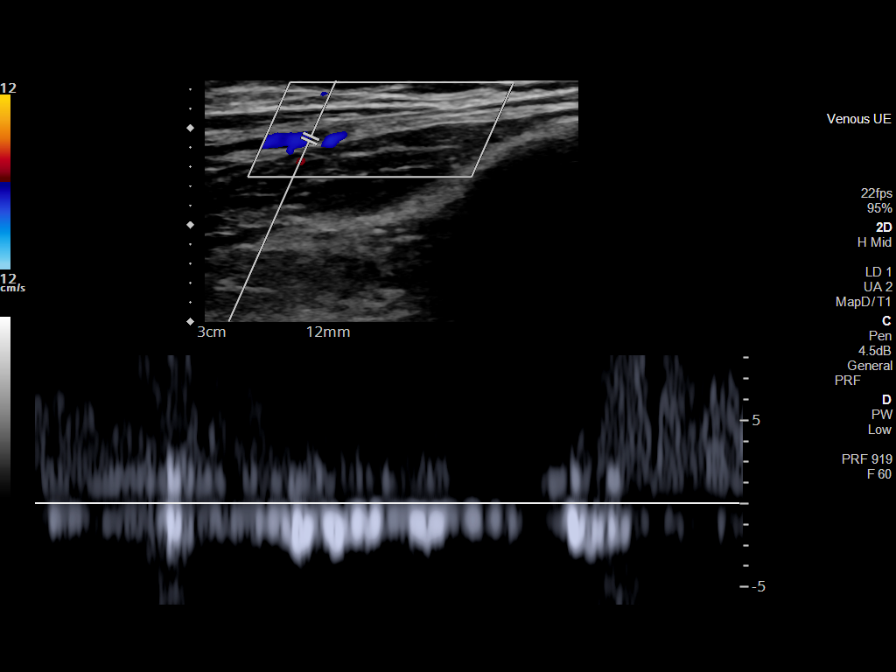
[im 4/8]
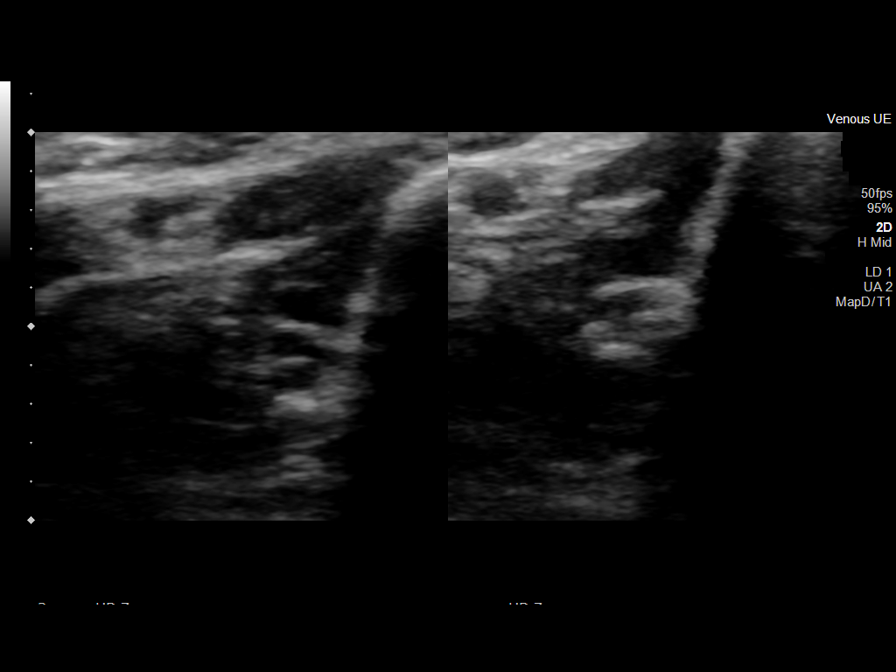
[im 5/8]
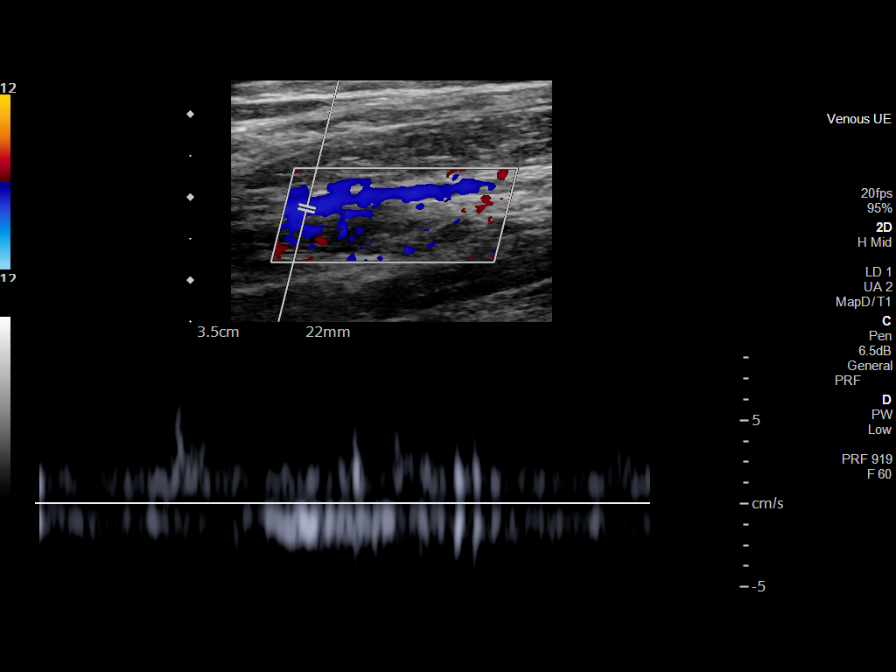
[im 6/8]
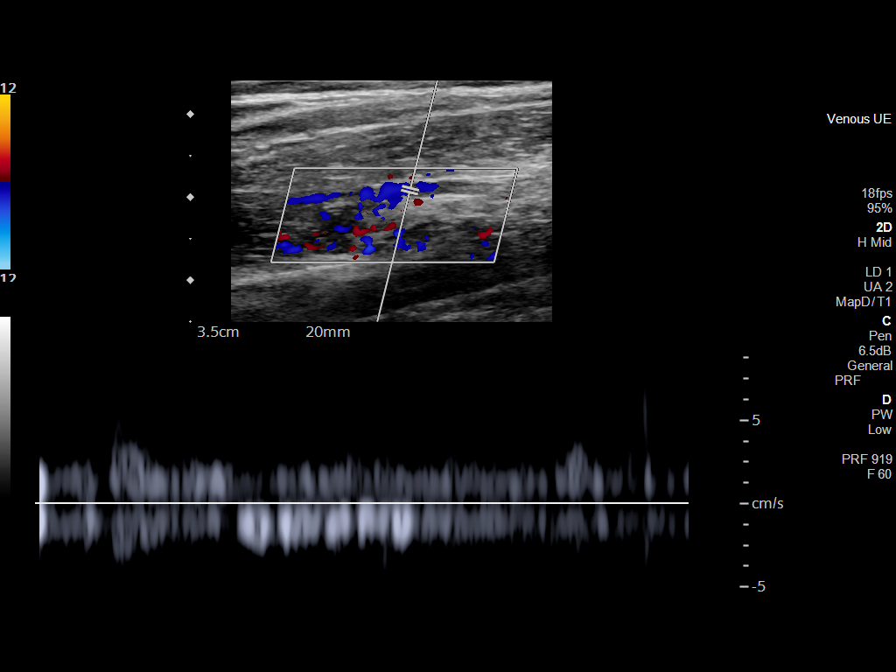
[im 7/8]
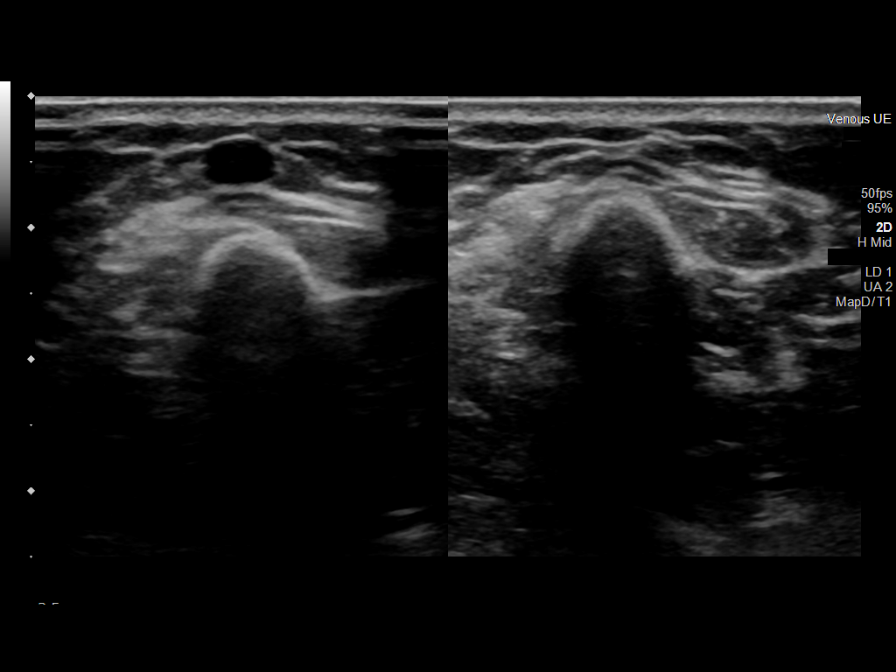
[im 8/8]
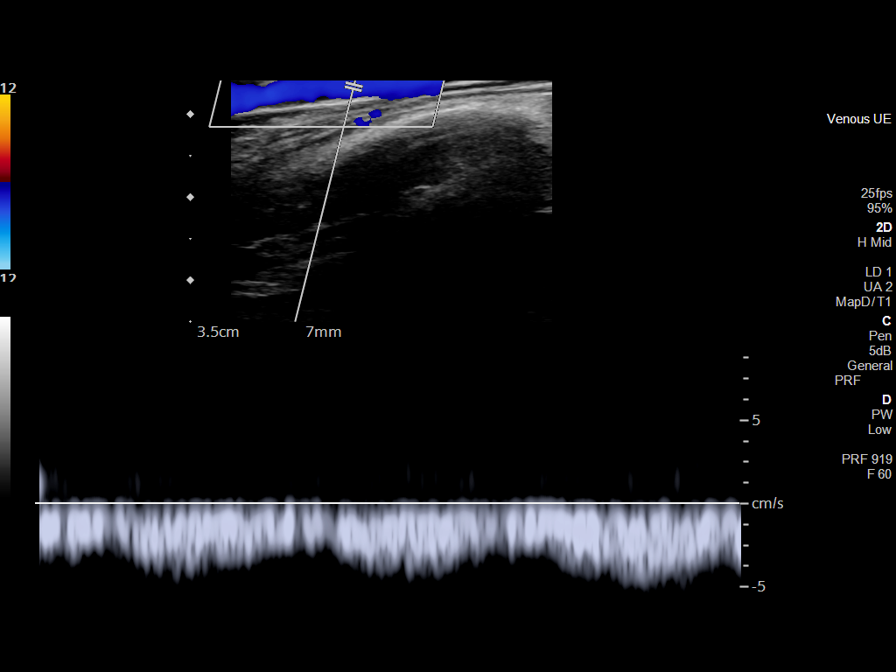

[8 of 8 positions shown; findings below may reference images not displayed]

FINDINGS: Contralateral Subclavian Vein: Respiratory phasicity is normal and
symmetric with the symptomatic side. No evidence of thrombus. Normal
compressibility.

Internal Jugular Vein: No evidence of thrombus. Normal
compressibility, respiratory phasicity and response to augmentation.

Subclavian Vein: No evidence of thrombus. Normal compressibility,
respiratory phasicity and response to augmentation.

Axillary Vein: No evidence of thrombus. Normal compressibility,
respiratory phasicity and response to augmentation.

Cephalic Vein: No evidence of thrombus. Normal compressibility,
respiratory phasicity and response to augmentation.

Basilic Vein: Positive for occlusive thrombus extending from the
proximal upper arm to the antecubital fossa.

Brachial Veins: No evidence of thrombus. Normal compressibility,
respiratory phasicity and response to augmentation.

Radial Veins: No evidence of thrombus. Normal compressibility,
respiratory phasicity and response to augmentation.

Ulnar Veins: No evidence of thrombus. Normal compressibility,
respiratory phasicity and response to augmentation.
IMPRESSION: 1. Negative for acute DVT within the left upper extremity.
2. Positive for acute occlusive thrombus/superficial
thrombophlebitis involving the basilic vein from the proximal upper
arm to the antecubital fossa
